# Patient Record
Sex: Female | Born: 1980 | Race: White | Hispanic: No | Smoking: Current every day smoker
Health system: Southern US, Community
[De-identification: ages and names within clinical notes are randomized; demographics above are authoritative.]

## PROBLEM LIST (undated history)

## (undated) DIAGNOSIS — J45909 Unspecified asthma, uncomplicated: Secondary | ICD-10-CM

## (undated) HISTORY — PX: CHOLECYSTECTOMY: SHX55

---

## 2004-09-08 ENCOUNTER — Emergency Department: Payer: Self-pay | Admitting: Emergency Medicine

## 2004-09-08 ENCOUNTER — Observation Stay: Payer: Self-pay | Admitting: Obstetrics & Gynecology

## 2004-09-09 ENCOUNTER — Inpatient Hospital Stay: Payer: Self-pay

## 2004-09-13 ENCOUNTER — Observation Stay: Payer: Self-pay

## 2004-09-30 ENCOUNTER — Observation Stay: Payer: Self-pay | Admitting: Obstetrics & Gynecology

## 2004-10-10 ENCOUNTER — Observation Stay: Payer: Self-pay

## 2004-10-17 ENCOUNTER — Observation Stay: Payer: Self-pay | Admitting: Obstetrics & Gynecology

## 2004-10-18 ENCOUNTER — Observation Stay: Payer: Self-pay | Admitting: Obstetrics & Gynecology

## 2004-10-20 ENCOUNTER — Observation Stay: Payer: Self-pay

## 2004-11-04 ENCOUNTER — Inpatient Hospital Stay: Payer: Self-pay

## 2004-11-14 ENCOUNTER — Emergency Department: Payer: Self-pay | Admitting: Emergency Medicine

## 2005-06-11 ENCOUNTER — Emergency Department: Payer: Self-pay | Admitting: Emergency Medicine

## 2005-06-11 ENCOUNTER — Other Ambulatory Visit: Payer: Self-pay

## 2005-11-05 ENCOUNTER — Emergency Department: Payer: Self-pay | Admitting: Emergency Medicine

## 2005-11-17 ENCOUNTER — Emergency Department: Payer: Self-pay | Admitting: Unknown Physician Specialty

## 2006-01-26 ENCOUNTER — Emergency Department: Payer: Self-pay | Admitting: Emergency Medicine

## 2006-04-20 ENCOUNTER — Emergency Department: Payer: Self-pay | Admitting: Emergency Medicine

## 2006-07-03 ENCOUNTER — Emergency Department: Payer: Self-pay | Admitting: Emergency Medicine

## 2006-08-02 ENCOUNTER — Emergency Department: Payer: Self-pay | Admitting: Emergency Medicine

## 2006-09-07 ENCOUNTER — Emergency Department: Payer: Self-pay | Admitting: Emergency Medicine

## 2006-09-30 ENCOUNTER — Emergency Department: Payer: Self-pay | Admitting: Emergency Medicine

## 2006-10-10 ENCOUNTER — Emergency Department: Payer: Self-pay | Admitting: General Practice

## 2006-12-02 ENCOUNTER — Emergency Department: Payer: Self-pay | Admitting: Emergency Medicine

## 2007-03-01 ENCOUNTER — Emergency Department: Payer: Self-pay | Admitting: Emergency Medicine

## 2007-03-13 ENCOUNTER — Emergency Department: Payer: Self-pay | Admitting: Emergency Medicine

## 2007-05-09 ENCOUNTER — Emergency Department: Payer: Self-pay | Admitting: Emergency Medicine

## 2007-08-09 ENCOUNTER — Emergency Department: Payer: Self-pay | Admitting: Emergency Medicine

## 2007-08-20 ENCOUNTER — Emergency Department: Payer: Self-pay | Admitting: Emergency Medicine

## 2007-09-02 ENCOUNTER — Emergency Department: Payer: Self-pay | Admitting: Emergency Medicine

## 2007-09-22 ENCOUNTER — Emergency Department: Payer: Self-pay | Admitting: Emergency Medicine

## 2007-11-04 ENCOUNTER — Ambulatory Visit: Payer: Self-pay

## 2007-11-12 ENCOUNTER — Observation Stay: Payer: Self-pay

## 2007-11-18 ENCOUNTER — Emergency Department: Payer: Self-pay | Admitting: Emergency Medicine

## 2007-11-29 ENCOUNTER — Emergency Department: Payer: Self-pay

## 2007-12-12 ENCOUNTER — Observation Stay: Payer: Self-pay | Admitting: Obstetrics & Gynecology

## 2007-12-13 ENCOUNTER — Ambulatory Visit: Payer: Self-pay | Admitting: Obstetrics & Gynecology

## 2007-12-29 ENCOUNTER — Observation Stay: Payer: Self-pay | Admitting: Obstetrics and Gynecology

## 2008-01-14 ENCOUNTER — Emergency Department: Payer: Self-pay | Admitting: Emergency Medicine

## 2008-03-05 ENCOUNTER — Observation Stay: Payer: Self-pay

## 2008-03-19 ENCOUNTER — Observation Stay: Payer: Self-pay | Admitting: Unknown Physician Specialty

## 2008-03-20 ENCOUNTER — Observation Stay: Payer: Self-pay | Admitting: Obstetrics and Gynecology

## 2008-03-22 ENCOUNTER — Inpatient Hospital Stay: Payer: Self-pay

## 2008-03-24 ENCOUNTER — Observation Stay: Payer: Self-pay | Admitting: Obstetrics and Gynecology

## 2008-03-31 ENCOUNTER — Observation Stay: Payer: Self-pay | Admitting: Obstetrics & Gynecology

## 2008-04-02 ENCOUNTER — Observation Stay: Payer: Self-pay

## 2008-04-07 ENCOUNTER — Observation Stay: Payer: Self-pay

## 2008-04-09 ENCOUNTER — Observation Stay: Payer: Self-pay

## 2008-04-11 ENCOUNTER — Observation Stay: Payer: Self-pay | Admitting: Obstetrics & Gynecology

## 2008-04-14 ENCOUNTER — Inpatient Hospital Stay: Payer: Self-pay | Admitting: Obstetrics and Gynecology

## 2008-05-22 ENCOUNTER — Emergency Department: Payer: Self-pay | Admitting: Emergency Medicine

## 2008-05-28 ENCOUNTER — Emergency Department: Payer: Self-pay | Admitting: Emergency Medicine

## 2008-06-08 ENCOUNTER — Ambulatory Visit: Payer: Self-pay | Admitting: Unknown Physician Specialty

## 2008-07-03 ENCOUNTER — Emergency Department: Payer: Self-pay | Admitting: Unknown Physician Specialty

## 2008-07-30 ENCOUNTER — Emergency Department: Payer: Self-pay | Admitting: Emergency Medicine

## 2008-08-31 ENCOUNTER — Emergency Department: Payer: Self-pay | Admitting: Emergency Medicine

## 2008-11-26 ENCOUNTER — Emergency Department: Payer: Self-pay | Admitting: Emergency Medicine

## 2010-05-08 ENCOUNTER — Emergency Department: Payer: Self-pay | Admitting: Emergency Medicine

## 2010-05-21 ENCOUNTER — Emergency Department: Payer: Self-pay | Admitting: Emergency Medicine

## 2010-06-07 ENCOUNTER — Emergency Department: Payer: Self-pay | Admitting: Emergency Medicine

## 2010-08-02 ENCOUNTER — Emergency Department: Payer: Self-pay | Admitting: Emergency Medicine

## 2010-11-08 ENCOUNTER — Emergency Department: Payer: Self-pay | Admitting: Emergency Medicine

## 2010-12-15 ENCOUNTER — Emergency Department: Payer: Self-pay | Admitting: Internal Medicine

## 2011-02-02 ENCOUNTER — Emergency Department: Payer: Self-pay | Admitting: Emergency Medicine

## 2011-02-06 ENCOUNTER — Emergency Department: Payer: Self-pay | Admitting: Emergency Medicine

## 2011-02-09 ENCOUNTER — Emergency Department: Payer: Self-pay | Admitting: Emergency Medicine

## 2011-02-17 ENCOUNTER — Emergency Department: Payer: Self-pay | Admitting: Internal Medicine

## 2011-03-20 ENCOUNTER — Emergency Department: Payer: Self-pay | Admitting: Internal Medicine

## 2012-02-03 ENCOUNTER — Emergency Department: Payer: Self-pay | Admitting: Emergency Medicine

## 2012-02-29 ENCOUNTER — Emergency Department: Payer: Self-pay | Admitting: Emergency Medicine

## 2012-04-20 ENCOUNTER — Emergency Department: Payer: Self-pay | Admitting: Emergency Medicine

## 2012-05-25 ENCOUNTER — Emergency Department: Payer: Self-pay | Admitting: Emergency Medicine

## 2012-05-25 LAB — COMPREHENSIVE METABOLIC PANEL
Albumin: 3.7 g/dL (ref 3.4–5.0)
Alkaline Phosphatase: 125 U/L (ref 50–136)
Calcium, Total: 8.8 mg/dL (ref 8.5–10.1)
Co2: 28 mmol/L (ref 21–32)
Creatinine: 0.99 mg/dL (ref 0.60–1.30)
EGFR (African American): >60
EGFR (Non-African Amer.): >60
Glucose: 103 mg/dL — ABNORMAL HIGH (ref 65–99)
Osmolality: 277 (ref 275–301)
SGOT(AST): 29 U/L (ref 15–37)

## 2012-05-25 LAB — URINALYSIS, COMPLETE
Bilirubin,UR: NEGATIVE
Glucose,UR: NEGATIVE mg/dL (ref 0–75)
Leukocyte Esterase: NEGATIVE
Nitrite: NEGATIVE
RBC,UR: <1 /HPF (ref 0–5)
Squamous Epithelial: <1
WBC UR: 1 /HPF (ref 0–5)

## 2012-05-25 LAB — CBC
HCT: 46.9 % (ref 35.0–47.0)
HGB: 15.4 g/dL (ref 12.0–16.0)
Platelet: 237 10*3/uL (ref 150–440)
RBC: 5.44 10*6/uL — ABNORMAL HIGH (ref 3.80–5.20)
RDW: 14.7 % — ABNORMAL HIGH (ref 11.5–14.5)
WBC: 11.2 10*3/uL — ABNORMAL HIGH (ref 3.6–11.0)

## 2012-05-25 LAB — LIPASE, BLOOD: Lipase: 55 U/L — ABNORMAL LOW (ref 73–393)

## 2012-11-07 ENCOUNTER — Emergency Department: Payer: Self-pay | Admitting: Internal Medicine

## 2012-11-07 LAB — COMPREHENSIVE METABOLIC PANEL
Albumin: 3.7 g/dL (ref 3.4–5.0)
Anion Gap: 7 (ref 7–16)
Calcium, Total: 8.8 mg/dL (ref 8.5–10.1)
Co2: 25 mmol/L (ref 21–32)
EGFR (African American): >60
EGFR (Non-African Amer.): >60
Glucose: 88 mg/dL (ref 65–99)
Potassium: 3.5 mmol/L (ref 3.5–5.1)
SGOT(AST): 25 U/L (ref 15–37)
SGPT (ALT): 32 U/L (ref 12–78)

## 2012-11-07 LAB — URINALYSIS, COMPLETE
Bacteria: NONE SEEN
Bilirubin,UR: NEGATIVE
Glucose,UR: NEGATIVE mg/dL (ref 0–75)
Leukocyte Esterase: NEGATIVE
Nitrite: NEGATIVE
RBC,UR: <1 /HPF (ref 0–5)
Squamous Epithelial: <1

## 2012-11-07 LAB — CBC
HCT: 43.1 % (ref 35.0–47.0)
MCHC: 33.9 g/dL (ref 32.0–36.0)
Platelet: 246 10*3/uL (ref 150–440)
RDW: 14.7 % — ABNORMAL HIGH (ref 11.5–14.5)
WBC: 11.3 10*3/uL — ABNORMAL HIGH (ref 3.6–11.0)

## 2012-11-08 ENCOUNTER — Emergency Department: Payer: Self-pay | Admitting: Emergency Medicine

## 2012-11-08 LAB — CBC
HCT: 44.2 % (ref 35.0–47.0)
HGB: 15.4 g/dL (ref 12.0–16.0)
MCHC: 34.7 g/dL (ref 32.0–36.0)
MCV: 84 fL (ref 80–100)
Platelet: 263 10*3/uL (ref 150–440)
RBC: 5.25 10*6/uL — ABNORMAL HIGH (ref 3.80–5.20)
RDW: 14.7 % — ABNORMAL HIGH (ref 11.5–14.5)
WBC: 11.3 10*3/uL — ABNORMAL HIGH (ref 3.6–11.0)

## 2012-11-09 ENCOUNTER — Emergency Department: Payer: Self-pay | Admitting: Emergency Medicine

## 2013-06-19 ENCOUNTER — Emergency Department: Payer: Self-pay | Admitting: Emergency Medicine

## 2013-06-19 LAB — URINALYSIS, COMPLETE
Bilirubin,UR: NEGATIVE
Glucose,UR: NEGATIVE mg/dL (ref 0–75)
Leukocyte Esterase: NEGATIVE
Nitrite: NEGATIVE
Ph: 6 (ref 4.5–8.0)
Protein: NEGATIVE
RBC,UR: <1 /HPF (ref 0–5)

## 2013-06-19 LAB — COMPREHENSIVE METABOLIC PANEL
Albumin: 3.7 g/dL (ref 3.4–5.0)
Anion Gap: 8 (ref 7–16)
Bilirubin,Total: 0.4 mg/dL (ref 0.2–1.0)
Calcium, Total: 8.9 mg/dL (ref 8.5–10.1)
Chloride: 107 mmol/L (ref 98–107)
Co2: 23 mmol/L (ref 21–32)
Creatinine: 0.7 mg/dL (ref 0.60–1.30)
EGFR (Non-African Amer.): >60
Osmolality: 273 (ref 275–301)
SGOT(AST): 22 U/L (ref 15–37)

## 2013-06-19 LAB — CBC
HGB: 15.7 g/dL (ref 12.0–16.0)
MCH: 29 pg (ref 26.0–34.0)
MCV: 84 fL (ref 80–100)

## 2013-08-02 ENCOUNTER — Emergency Department: Payer: Self-pay | Admitting: Emergency Medicine

## 2015-03-09 ENCOUNTER — Emergency Department
Admit: 2015-03-09 | Disposition: A | Discharge status: Home / Self Care | Payer: Self-pay | Admitting: Emergency Medicine

## 2015-03-23 ENCOUNTER — Emergency Department: Admit: 2015-03-23 | Disposition: A | Discharge status: Home / Self Care | Payer: Self-pay | Admitting: Student

## 2015-11-01 ENCOUNTER — Encounter: Payer: Self-pay | Admitting: Emergency Medicine

## 2015-11-01 ENCOUNTER — Emergency Department
Admission: EM | Admit: 2015-11-01 | Discharge: 2015-11-01 | Disposition: A | Discharge status: Home / Self Care | Payer: Self-pay | Attending: Emergency Medicine | Admitting: Emergency Medicine

## 2015-11-01 DIAGNOSIS — K029 Dental caries, unspecified: Secondary | ICD-10-CM | POA: Insufficient documentation

## 2015-11-01 DIAGNOSIS — K047 Periapical abscess without sinus: Secondary | ICD-10-CM | POA: Insufficient documentation

## 2015-11-01 DIAGNOSIS — K0381 Cracked tooth: Secondary | ICD-10-CM | POA: Insufficient documentation

## 2015-11-01 DIAGNOSIS — F17200 Nicotine dependence, unspecified, uncomplicated: Secondary | ICD-10-CM | POA: Insufficient documentation

## 2015-11-01 HISTORY — DX: Unspecified asthma, uncomplicated: J45.909

## 2015-11-01 MED ORDER — TRAMADOL HCL 50 MG PO TABS
50.0000 mg | ORAL_TABLET | Freq: Once | ORAL | Status: AC
  Administered 2015-11-01: 50 mg via ORAL
  Filled 2015-11-01: qty 1

## 2015-11-01 MED ORDER — PENICILLIN V POTASSIUM 500 MG PO TABS: 500.0000 mg | ORAL_TABLET | Freq: Four times a day (QID) | ORAL | Status: DC

## 2015-11-01 MED ORDER — NAPROXEN 500 MG PO TBEC: 500.0000 mg | DELAYED_RELEASE_TABLET | Freq: Two times a day (BID) | ORAL | Status: DC

## 2015-11-01 MED ORDER — PENICILLIN V POTASSIUM 500 MG PO TABS
500.0000 mg | ORAL_TABLET | Freq: Once | ORAL | Status: AC
  Administered 2015-11-01: 500 mg via ORAL
  Filled 2015-11-01: qty 1

## 2015-11-01 NOTE — ED Provider Notes (Signed)
[facility] Emergency Department Provider Note ____________________________________________  Time seen: 0832  I have reviewed the triage vital signs and the nursing notes.  HISTORY  Chief Complaint  Dental Pain  HPI Jodi Tran is a 34 y.o. female reports to the ED for evaluation of toothache pain to the right lower jaw for the last 4 days. She denies any interim fever, chills, or sweats. She does note poor dental hygiene and reports a chronically decayed and broken molar on the right lower jaw. She claims to have an appointment scheduled for a dental provider in the new year. She rates her discomfort is 9/10 in triage.  Past Medical History  Diagnosis Date  . Asthma     There are no active problems to display for this patient.   History reviewed. No pertinent past surgical history.  Current Outpatient Rx  Name  Route  Sig  Dispense  Refill  . albuterol (PROVENTIL HFA;VENTOLIN HFA) 108 (90 BASE) MCG/ACT inhaler   Inhalation   Inhale 2 puffs into the lungs every 6 (six) hours as needed for wheezing or shortness of breath.         . naproxen (EC NAPROSYN) 500 MG EC tablet   Oral   Take 1 tablet (500 mg total) by mouth 2 (two) times daily with a meal.   30 tablet   0   . penicillin v potassium (VEETID) 500 MG tablet   Oral   Take 1 tablet (500 mg total) by mouth 4 (four) times daily.   40 tablet   0    Allergies Bactrim  No family history on file.  Social History Social History  Substance Use Topics  . Smoking status: Current Every Day Smoker  . Smokeless tobacco: None  . Alcohol Use: No   Review of Systems  Constitutional: Negative for fever. Eyes: Negative for visual changes. ENT: Negative for sore throat. Dental pain as above. Cardiovascular: Negative for chest pain. Respiratory: Negative for shortness of breath. Gastrointestinal: Negative for abdominal pain, vomiting and diarrhea. Genitourinary: Negative for  dysuria. Musculoskeletal: Negative for back pain. Skin: Negative for rash. Neurological: Negative for headaches, focal weakness or numbness. ____________________________________________  PHYSICAL EXAM:  VITAL SIGNS: ED Triage Vitals  Enc Vitals Group     BP 11/01/15 0801 120/78 mmHg     Pulse Rate 11/01/15 0801 75     Resp 11/01/15 0801 18     Temp 11/01/15 0801 97.9 F (36.6 C)     Temp Source 11/01/15 0801 Oral     SpO2 11/01/15 0801 99 %     Weight 11/01/15 0801 159 lb (72.122 kg)     Height 11/01/15 0801 4\' 11"  (1.499 m)     Head Cir --      Peak Flow --      Pain Score 11/01/15 0802 9     Pain Loc --      Pain Edu? --      Excl. in GC? --    Constitutional: Alert and oriented. Well appearing and in no distress. Head: Normocephalic and atraumatic.      Eyes: Conjunctivae are normal. PERRL. Normal extraocular movements      Ears: Canals clear. TMs intact bilaterally.   Nose: No congestion/rhinorrhea.   Mouth/Throat: Mucous membranes are moist. The right lower jaw shows a chronically broken and decayed second molar. There is no obvious gum swelling or edema appreciated.   Neck: Supple. No thyromegaly. Hematological/Lymphatic/Immunological: No cervical lymphadenopathy. Cardiovascular: Normal rate, regular  rhythm.  Respiratory: Normal respiratory effort. No wheezes/rales/rhonchi. Gastrointestinal: Soft and nontender. No distention. Musculoskeletal: Nontender with normal range of motion in all extremities.  Neurologic:  Normal gait without ataxia. Normal speech and language. No gross focal neurologic deficits are appreciated. Skin:  Skin is warm, dry and intact. No rash noted. Psychiatric: Mood and affect are normal. Patient exhibits appropriate insight and judgment. ____________________________________________  PROCEDURES  Ultram 50 mg po Pen VK 500 mg po ____________________________________________  INITIAL IMPRESSION / ASSESSMENT AND PLAN / ED  COURSE  Patient with acute dental pain secondary to chronic dental caries and chronically fractured right lower molar. Patient discharged with prescription for pen VK to dose as directed. She is also provided with a prescription for EC-Naprosyn and a dose as needed for pain and swelling. Dental clinic follow-up list is provided for patient evaluation and definitive treatment of fractured tooth. ____________________________________________  FINAL CLINICAL IMPRESSION(S) / ED DIAGNOSES  Final diagnoses:  Dental abscess  Dental caries extending into pulp      Lissa Hoard, PA-C 11/01/15 1610  Jeanmarie Plant, MD 11/01/15 1521

## 2015-11-01 NOTE — ED Notes (Signed)
Pt here with c/o dental pain for 3 days now.

## 2015-11-01 NOTE — Discharge Instructions (Signed)
Dental Abscess A dental abscess is pus in or around a tooth. HOME CARE  Take medicines only as told by your dentist.  If you were prescribed antibiotic medicine, finish all of it even if you start to feel better.  Rinse your mouth (gargle) often with salt water.  Do not drive or use heavy machinery, like a lawn mower, while taking pain medicine.  Do not apply heat to the outside of your mouth.  Keep all follow-up visits as told by your dentist. This is important. GET HELP IF:  Your pain is worse, and medicine does not help. GET HELP RIGHT AWAY IF:  You have a fever or chills.  Your symptoms suddenly get worse.  You have a very bad headache.  You have problems breathing or swallowing.  You have trouble opening your mouth.  You have puffiness (swelling) in your neck or around your eye.   This information is not intended to replace advice given to you by your health care provider. Make sure you discuss any questions you have with your health care provider.   Document Released: 03/27/2015 Document Reviewed: 03/27/2015 Elsevier Interactive Patient Education 2016 Elsevier Inc.  Dental Caries Dental caries is tooth decay. This decay can cause a hole in teeth (cavity) that can get bigger and deeper over time. HOME CARE  Brush and floss your teeth. Do this at least two times a day.  Use a fluoride toothpaste.  Use a mouth rinse if told by your dentist or doctor.  Eat less sugary and starchy foods. Drink less sugary drinks.  Avoid snacking often on sugary and starchy foods. Avoid sipping often on sugary drinks.  Keep regular checkups and cleanings with your dentist.  Use fluoride supplements if told by your dentist or doctor.  Allow fluoride to be applied to teeth if told by your dentist or doctor.   This information is not intended to replace advice given to you by your health care provider. Make sure you discuss any questions you have with your health care  provider.   Document Released: 08/19/2008 Document Revised: 12/01/2014 Document Reviewed: 11/12/2012 Elsevier Interactive Patient Education 2016 Elsevier Inc.  Dental Pain Dental pain may be caused by many things, including:  Tooth decay (cavities or caries). Cavities expose the nerve of your tooth to air and hot or cold temperatures. This can cause pain or discomfort.  Abscess or infection. A dental abscess is a collection of infected pus from a bacterial infection in the inner part of the tooth (pulp). It usually occurs at the end of the tooth's root.  Injury.  An unknown reason (idiopathic). Your pain may be mild or severe. It may only occur when:  You are chewing.  You are exposed to hot or cold temperature.  You are eating or drinking sugary foods or beverages, such as soda or candy. Your pain may also be constant. HOME CARE INSTRUCTIONS Watch your dental pain for any changes. The following actions may help to lessen any discomfort that you are feeling:  Take medicines only as directed by your dentist.  If you were prescribed an antibiotic medicine, finish all of it even if you start to feel better.  Keep all follow-up visits as directed by your dentist. This is important.  Do not apply heat to the outside of your face.  Rinse your mouth or gargle with salt water if directed by your dentist. This helps with pain and swelling.  You can make salt water by adding  tsp  of salt to 1 cup of warm water.  Apply ice to the painful area of your face:  Put ice in a plastic bag.  Place a towel between your skin and the bag.  Leave the ice on for 20 minutes, 2-3 times per day.  Avoid foods or drinks that cause you pain, such as:  Very hot or very cold foods or drinks.  Sweet or sugary foods or drinks. SEEK MEDICAL CARE IF:  Your pain is not controlled with medicines.  Your symptoms are worse.  You have new symptoms. SEEK IMMEDIATE MEDICAL CARE IF:  You are unable  to open your mouth.  You are having trouble breathing or swallowing.  You have a fever.  Your face, neck, or jaw is swollen.   This information is not intended to replace advice given to you by your health care provider. Make sure you discuss any questions you have with your health care provider.   Document Released: 11/10/2005 Document Revised: 03/27/2015 Document Reviewed: 11/06/2014 Elsevier Interactive Patient Education [employer] Elsevier Inc.  Take the prescription antibiotic until completely gone. Follow-up with one of the dental clinics listed for dental extraction.  OPTIONS FOR DENTAL FOLLOW UP CARE  [place] Department of Health and Human Services - Local Safety Net Dental Clinics TripDoors.comhttp://www.ncdhhs.gov/dph/oralhealth/services/safetynetclinics.htm   [facility] [phone])  [facility] [phone])  [facility] [phone]  [facility] [phone])  [facility] [phone]) This clinic caters to the indigent population and is on a lottery system. Location: [employer] [address] [address], [address], [place] [facility] Hours: Wednesdays from 6pm - 9pm, patients seen by a lottery system. For dates, call or go to [email or web address] Services: Cleanings, fillings and simple extractions. Payment Options: DENTAL WORK IS FREE OF CHARGE. Bring proof of income or support. Best way to get seen: Arrive at 5:15 pm - this is a lottery, NOT first come/first serve, so arriving earlier will not increase your chances of being seen.     [facility] [phone] Select option 1 for emergencies   Location: [facility] [place], [address], [place] [facility] Hours: No walk-ins accepted - call the day before to schedule an appointment. Check in times are 9:30 am and 1:30 pm. Services: Simple extractions, temporary fillings,  pulpectomy/pulp debridement, uncomplicated abscess drainage. Payment Options: PAYMENT IS DUE AT THE TIME OF SERVICE.  Fee is usually $100-200, additional surgical procedures (e.g. abscess drainage) may be extra. Cash, checks, Visa/MasterCard accepted.  Can file Medicaid if patient is covered for dental - patient should call case worker to check. No discount for [facility] patients. Best way to get seen: MUST call the day before and get onto the schedule. Can usually be seen the next 1-2 days. No walk-ins accepted.     [facility] [phone]   Location: [facility], [address], [place] Clinic Hours: M, W, Th, F 8am or 1:30pm, Tues 9a or 1:30 - first come/first served. Services: Simple extractions, temporary fillings, uncomplicated abscess drainage.  You do not need to be an [facility] [place] [facility] [place] resident. Payment Options: PAYMENT IS DUE AT THE TIME OF SERVICE. Dental insurance, otherwise sliding scale - bring proof of income or support. Depending on income and treatment needed, cost is usually $50-200. Best way to get seen: Arrive early as it is first come/first served.     [facility] [phone]  Location: [address] Clinic Hours: Mon-Thu 8a-5p Services: Most basic dental services including extractions and fillings. Payment Options: PAYMENT IS DUE AT THE TIME OF SERVICE. Sliding scale, up to 50% off - bring proof if income or support. Medicaid with dental option accepted. Best way to get seen: Call to schedule an appointment, can usually be seen within 2 weeks OR they will try to see walk-ins - show up at 8a or 2p (you may have to wait).     [facility] [phone] [place] RESIDENTS ONLY   Location: [facility], [address], [place], [place] [address] Clinic Hours: By appointment only. Monday - Thursday 8am-5pm, Friday  8am-12pm Services: Cleanings, fillings, extractions. Payment Options: PAYMENT IS DUE AT THE TIME OF SERVICE. Cash, Visa or MasterCard. Sliding scale - $30 minimum per service. Best way to get seen: Come in to office, complete packet and make an appointment - need proof of income or support monies for each household member and proof of [facility] residence. Usually takes about a month to get in.     [facility] [phone]   Location: [address] [facility] Clinic Hours: Walk-in Urgent Care Dental Services are offered Monday-Friday mornings only. The numbers of emergencies accepted daily is limited to the number of providers available. Maximum 15 - Mondays, Wednesdays & Thursdays Maximum 10 - Tuesdays & Fridays Services: You do not need to be a [facility] resident to be seen for a dental emergency. Emergencies are defined as pain, swelling, abnormal bleeding, or dental trauma. Walkins will receive x-rays if needed. NOTE: Dental cleaning is not an emergency. Payment Options: PAYMENT IS DUE AT THE TIME OF SERVICE. Minimum co-pay is $40.00 for uninsured patients. Minimum co-pay is $3.00 for Medicaid with dental coverage. Dental Insurance is accepted and must be presented at time of visit. Medicare does not cover dental. Forms of payment: Cash, credit card, checks. Best way to get seen: If not previously registered with the clinic, walk-in dental registration begins at 7:15 am and is on a first come/first serve basis. If previously registered with the clinic, call to make an appointment.     [facility] [phone] [place] RESIDENTS ONLY   Location: [address], [place], [place] Clinic Hours: Mon-Thu 10a-2p Services: Extractions only! Payment Options: FREE (donations accepted) - bring proof of income or support Best way to get seen: Call and schedule an appointment OR come at 8am on the 1st Monday of every month  (except for holidays) when it is first come/first served.     [facility] [phone]   Location: [address] [place], [place] Clinic Hours: Friday mornings Services, Payment Options, Best way to get seen: Call for info

## 2015-12-31 ENCOUNTER — Encounter: Payer: Self-pay | Admitting: Medical Oncology

## 2015-12-31 ENCOUNTER — Emergency Department
Admission: EM | Admit: 2015-12-31 | Discharge: 2015-12-31 | Disposition: A | Discharge status: Home / Self Care | Payer: Self-pay | Attending: Emergency Medicine | Admitting: Emergency Medicine

## 2015-12-31 DIAGNOSIS — K0381 Cracked tooth: Secondary | ICD-10-CM | POA: Insufficient documentation

## 2015-12-31 DIAGNOSIS — K122 Cellulitis and abscess of mouth: Secondary | ICD-10-CM | POA: Insufficient documentation

## 2015-12-31 DIAGNOSIS — K029 Dental caries, unspecified: Secondary | ICD-10-CM | POA: Insufficient documentation

## 2015-12-31 DIAGNOSIS — F17200 Nicotine dependence, unspecified, uncomplicated: Secondary | ICD-10-CM | POA: Insufficient documentation

## 2015-12-31 MED ORDER — IBUPROFEN 800 MG PO TABS: 800.0000 mg | ORAL_TABLET | Freq: Three times a day (TID) | ORAL | Status: DC | PRN

## 2015-12-31 MED ORDER — AMOXICILLIN 500 MG PO TABS: 500.0000 mg | ORAL_TABLET | Freq: Three times a day (TID) | ORAL | Status: DC

## 2015-12-31 MED ORDER — HYDROCODONE-ACETAMINOPHEN 5-325 MG PO TABS: 1.0000 | ORAL_TABLET | Freq: Four times a day (QID) | ORAL | Status: DC | PRN

## 2015-12-31 MED ORDER — HYDROCODONE-ACETAMINOPHEN 5-325 MG PO TABS
1.0000 | ORAL_TABLET | Freq: Once | ORAL | Status: AC
  Administered 2015-12-31: 1 via ORAL
  Filled 2015-12-31: qty 1

## 2015-12-31 NOTE — ED Provider Notes (Signed)
[facility] Emergency Department Provider Note  ____________________________________________  Time seen: Approximately 6:19 PM  I have reviewed the triage vital signs and the nursing notes.   HISTORY  Chief Complaint Dental Pain    HPI Jodi Tran is a 35 y.o. female, NAD, presents to the emergency department with right-sided lower dental pain 2 days. States she has an appointment at the [facility] dental clinic in April to start dental care. Denies any injury or trauma to start her pain at this time. Has noted some swelling and pain across the right lower jawline. Has history of dental infections which have been similar in the past.   Past Medical History  Diagnosis Date  . Asthma     There are no active problems to display for this patient.   History reviewed. No pertinent past surgical history.  Current Outpatient Rx  Name  Route  Sig  Dispense  Refill  . albuterol (PROVENTIL HFA;VENTOLIN HFA) 108 (90 BASE) MCG/ACT inhaler   Inhalation   Inhale 2 puffs into the lungs every 6 (six) hours as needed for wheezing or shortness of breath.         Marland Kitchen amoxicillin (AMOXIL) 500 MG tablet   Oral   Take 1 tablet (500 mg total) by mouth 3 (three) times daily with meals.   30 tablet   0   . HYDROcodone-acetaminophen (NORCO) 5-325 MG tablet   Oral   Take 1 tablet by mouth every 6 (six) hours as needed for severe pain.   6 tablet   0   . ibuprofen (ADVIL,MOTRIN) 800 MG tablet   Oral   Take 1 tablet (800 mg total) by mouth every 8 (eight) hours as needed (pain).   60 tablet   0     Allergies Bactrim  No family history on file.  Social History Social History  Substance Use Topics  . Smoking status: Current Every Day Smoker  . Smokeless tobacco: None  . Alcohol Use: No     Review of Systems  Constitutional: No fever/chills ENT: Positive for right lower gumline and tooth pain. No sore throat, ear pain Cardiovascular: No chest  pain. Respiratory: No shortness of breath Skin: Negative for rash, swelling. Neurological: Negative for headaches, focal weakness or numbness. 10-point ROS otherwise negative.  ____________________________________________   PHYSICAL EXAM:  VITAL SIGNS: ED Triage Vitals  Enc Vitals Group     BP 12/31/15 1700 144/84 mmHg     Pulse Rate 12/31/15 1700 85     Resp 12/31/15 1700 18     Temp 12/31/15 1700 98.1 F (36.7 C)     Temp Source 12/31/15 1700 Oral     SpO2 12/31/15 1700 97 %     Weight 12/31/15 1700 150 lb (68.04 kg)     Height 12/31/15 1700  (1.499 m)     Head Cir --      Peak Flow --      Pain Score 12/31/15 1701 10     Pain Loc --      Pain Edu? --      Excl. in GC? --     Constitutional: Alert and oriented. Well appearing and in no acute distress. In pain Eyes: Conjunctivae are normal. PERRL. EOMI without pain.  Head: Atraumatic. Mouth/Throat: Diffuse caries and broken teeth. Right lower gumline erythematous and mildly swollen about the posterior portion. Mucous membranes are moist.  Hematological/Lymphatic/Immunilogical: No cervical lymphadenopathy. Respiratory: Normal respiratory effort without tachypnea or retractions.  Neurologic:  Normal speech and language. No gross focal neurologic deficits are appreciated.  Skin:  Skin is warm, dry and intact. No rash noted. Psychiatric: Mood and affect are normal. Speech and behavior are normal. Patient exhibits appropriate insight and judgement.   ____________________________________________   LABS  None  ____________________________________________  EKG  None ____________________________________________  RADIOLOGY  None ____________________________________________    PROCEDURES  Procedure(s) performed: None    Medications  HYDROcodone-acetaminophen (NORCO/VICODIN) 5-325 MG per tablet 1 tablet (1 tablet Oral Given 12/31/15 1837)     ____________________________________________   INITIAL  IMPRESSION / ASSESSMENT AND PLAN / ED COURSE  Patient's diagnosis is consistent with abscess oral soft tissues due to poor dental hygiene. Patient will be discharged home with prescriptions for Amoxicillin 500 mg take one tablet 3 times daily with food 10 days as well as Norco one tablet by mouth every 4-6 hours as needed for severe pain. Advised taking ibuprofen 800 mg one tablet by mouth every 8 hours with food until pain decreases. Keep appointment with [facility] dental clinic in April.  Advise calling them to see if any earlier appointments are available for follow-up. Patient given precautions to return to the ED for worsening or new onset symptoms.    ____________________________________________  FINAL CLINICAL IMPRESSION(S) / ED DIAGNOSES  Final diagnoses:  Abscess or cellulitis, oral soft tissue  Dental caries      NEW MEDICATIONS STARTED DURING THIS VISIT:  New Prescriptions   AMOXICILLIN (AMOXIL) 500 MG TABLET    Take 1 tablet (500 mg total) by mouth 3 (three) times daily with meals.   HYDROCODONE-ACETAMINOPHEN (NORCO) 5-325 MG TABLET    Take 1 tablet by mouth every 6 (six) hours as needed for severe pain.   IBUPROFEN (ADVIL,MOTRIN) 800 MG TABLET    Take 1 tablet (800 mg total) by mouth every 8 (eight) hours as needed (pain).         Hope Pigeon, PA-C 12/31/15 1838  Minna Antis, MD 01/01/16 [phone]

## 2015-12-31 NOTE — ED Notes (Signed)
Pt has a right lower toothache for 2 days.  Pt tearful.  Pt taking otc meds without relief.

## 2015-12-31 NOTE — Discharge Instructions (Signed)
Dental Caries °Dental caries (also called tooth decay) is the most common oral disease. It can occur at any age but is more common in children and young adults.  °HOW DENTAL CARIES DEVELOPS  °The process of decay begins when bacteria and foods (particularly sugars and starches) combine in your mouth to produce plaque. Plaque is a substance that sticks to the hard, outer surface of a tooth (enamel). The bacteria in plaque produce acids that attack enamel. These acids may also attack the root surface of a tooth (cementum) if it is exposed. Repeated attacks dissolve these surfaces and create holes in the tooth (cavities). If left untreated, the acids destroy the other layers of the tooth.  °RISK FACTORS °· Frequent sipping of sugary beverages.   °· Frequent snacking on sugary and starchy foods, especially those that easily get stuck in the teeth.   °· Poor oral hygiene.   °· Dry mouth.   °· Substance abuse such as methamphetamine abuse.   °· Broken or poor-fitting dental restorations.   °· Eating disorders.   °· Gastroesophageal reflux disease (GERD).   °· Certain radiation treatments to the head and neck. °SYMPTOMS °In the early stages of dental caries, symptoms are seldom present. Sometimes white, chalky areas may be seen on the enamel or other tooth layers. In later stages, symptoms may include: °· Pits and holes on the enamel. °· Toothache after sweet, hot, or cold foods or drinks are consumed. °· Pain around the tooth. °· Swelling around the tooth. °DIAGNOSIS  °Most of the time, dental caries is detected during a regular dental checkup. A diagnosis is made after a thorough medical and dental history is taken and the surfaces of your teeth are checked for signs of dental caries. Sometimes special instruments, such as lasers, are used to check for dental caries. Dental X-ray exams may be taken so that areas not visible to the eye (such as between the contact areas of the teeth) can be checked for cavities.    °TREATMENT  °If dental caries is in its early stages, it may be reversed with a fluoride treatment or an application of a remineralizing agent at the dental office. Thorough brushing and flossing at home is needed to aid these treatments. If it is in its later stages, treatment depends on the location and extent of tooth destruction:  °· If a small area of the tooth has been destroyed, the destroyed area will be removed and cavities will be filled with a material such as gold, silver amalgam, or composite resin.   °· If a large area of the tooth has been destroyed, the destroyed area will be removed and a cap (crown) will be fitted over the remaining tooth structure.   °· If the center part of the tooth (pulp) is affected, a procedure called a root canal will be needed before a filling or crown can be placed.   °· If most of the tooth has been destroyed, the tooth may need to be pulled (extracted). °HOME CARE INSTRUCTIONS °You can prevent, stop, or reverse dental caries at home by practicing good oral hygiene. Good oral hygiene includes: °· Thoroughly cleaning your teeth at least twice a day with a toothbrush and dental floss.   °· Using a fluoride toothpaste. A fluoride mouth rinse may also be used if recommended by your dentist or health care provider.   °· Restricting the amount of sugary and starchy foods and sugary liquids you consume.   °· Avoiding frequent snacking on these foods and sipping of these liquids.   °· Keeping regular visits with   a dentist for checkups and cleanings. °PREVENTION  °· Practice good oral hygiene. °· Consider a dental sealant. A dental sealant is a coating material that is applied by your dentist to the pits and grooves of teeth. The sealant prevents food from being trapped in them. It may protect the teeth for several years. °· Ask about fluoride supplements if you live in a community without fluorinated water or with water that has a low fluoride content. Use fluoride supplements  as directed by your dentist or health care provider. °· Allow fluoride varnish applications to teeth if directed by your dentist or health care provider. °  °This information is not intended to replace advice given to you by your health care provider. Make sure you discuss any questions you have with your health care provider. °  °Document Released: 08/02/2002 Document Revised: 12/01/2014 Document Reviewed: 11/12/2012 °Elsevier Interactive Patient Education ©2016 [employer] ° °Dental Pain °Dental pain may be caused by many things, including: °· Tooth decay (cavities or caries). Cavities expose the nerve of your tooth to air and hot or cold temperatures. This can cause pain or discomfort. °· Abscess or infection. A dental abscess is a collection of infected pus from a bacterial infection in the inner part of the tooth (pulp). It usually occurs at the end of the tooth's root. °· Injury. °· An unknown reason (idiopathic). °Your pain may be mild or severe. It may only occur when: °· You are chewing. °· You are exposed to hot or cold temperature. °· You are eating or drinking sugary foods or beverages, such as soda or candy. °Your pain may also be constant. °HOME CARE INSTRUCTIONS °Watch your dental pain for any changes. The following actions may help to lessen any discomfort that you are feeling: °· Take medicines only as directed by your dentist. °· If you were prescribed an antibiotic medicine, finish all of it even if you start to feel better. °· Keep all follow-up visits as directed by your dentist. This is important. °· Do not apply heat to the outside of your face. °· Rinse your mouth or gargle with salt water if directed by your dentist. This helps with pain and swelling. °¨ You can make salt water by adding ¼ tsp of salt to 1 cup of warm water. °· Apply ice to the painful area of your face: °¨ Put ice in a plastic bag. °¨ Place a towel between your skin and the bag. °¨ Leave the ice on for 20 minutes, 2-3  times per day. °· Avoid foods or drinks that cause you pain, such as: °¨ Very hot or very cold foods or drinks. °¨ Sweet or sugary foods or drinks. °SEEK MEDICAL CARE IF: °· Your pain is not controlled with medicines. °· Your symptoms are worse. °· You have new symptoms. °SEEK IMMEDIATE MEDICAL CARE IF: °· You are unable to open your mouth. °· You are having trouble breathing or swallowing. °· You have a fever. °· Your face, neck, or jaw is swollen. °  °This information is not intended to replace advice given to you by your health care provider. Make sure you discuss any questions you have with your health care provider. °  °Document Released: 11/10/2005 Document Revised: 03/27/2015 Document Reviewed: 11/06/2014 °Elsevier Interactive Patient Education ©2016 [employer] ° °

## 2015-12-31 NOTE — ED Notes (Signed)
Rt sided lower dental pain.

## 2016-05-01 ENCOUNTER — Encounter: Payer: Self-pay | Admitting: Emergency Medicine

## 2016-05-01 ENCOUNTER — Emergency Department
Admission: EM | Admit: 2016-05-01 | Discharge: 2016-05-01 | Disposition: A | Discharge status: Home / Self Care | Payer: Self-pay | Attending: Emergency Medicine | Admitting: Emergency Medicine

## 2016-05-01 DIAGNOSIS — Z5321 Procedure and treatment not carried out due to patient leaving prior to being seen by health care provider: Secondary | ICD-10-CM | POA: Insufficient documentation

## 2016-05-01 DIAGNOSIS — F17210 Nicotine dependence, cigarettes, uncomplicated: Secondary | ICD-10-CM | POA: Insufficient documentation

## 2016-05-01 NOTE — ED Notes (Signed)
C/O body aches, cough, sinus congestion x 2 days.  Denies fever.

## 2016-05-01 NOTE — ED Notes (Signed)
Pt not found in lobby or outside of ED entrance.

## 2018-03-29 ENCOUNTER — Emergency Department: Payer: Self-pay

## 2018-03-29 ENCOUNTER — Encounter: Payer: Self-pay | Admitting: Emergency Medicine

## 2018-03-29 ENCOUNTER — Emergency Department
Admission: EM | Admit: 2018-03-29 | Discharge: 2018-03-29 | Disposition: A | Discharge status: Home / Self Care | Payer: Self-pay | Attending: Emergency Medicine | Admitting: Emergency Medicine

## 2018-03-29 DIAGNOSIS — Y929 Unspecified place or not applicable: Secondary | ICD-10-CM | POA: Insufficient documentation

## 2018-03-29 DIAGNOSIS — S62617A Displaced fracture of proximal phalanx of left little finger, initial encounter for closed fracture: Secondary | ICD-10-CM | POA: Insufficient documentation

## 2018-03-29 DIAGNOSIS — F17210 Nicotine dependence, cigarettes, uncomplicated: Secondary | ICD-10-CM | POA: Insufficient documentation

## 2018-03-29 DIAGNOSIS — Y939 Activity, unspecified: Secondary | ICD-10-CM | POA: Insufficient documentation

## 2018-03-29 DIAGNOSIS — W230XXA Caught, crushed, jammed, or pinched between moving objects, initial encounter: Secondary | ICD-10-CM | POA: Insufficient documentation

## 2018-03-29 DIAGNOSIS — J45909 Unspecified asthma, uncomplicated: Secondary | ICD-10-CM | POA: Insufficient documentation

## 2018-03-29 DIAGNOSIS — Y998 Other external cause status: Secondary | ICD-10-CM | POA: Insufficient documentation

## 2018-03-29 MED ORDER — IBUPROFEN 600 MG PO TABS: 600.0000 mg | ORAL_TABLET | Freq: Three times a day (TID) | ORAL | 0 refills | Status: DC | PRN

## 2018-03-29 MED ORDER — OXYCODONE-ACETAMINOPHEN 5-325 MG PO TABS
1.0000 | ORAL_TABLET | Freq: Once | ORAL | Status: AC
Start: 2018-03-29 — End: 2018-03-29
  Administered 2018-03-29: 1 via ORAL
  Filled 2018-03-29: qty 1

## 2018-03-29 MED ORDER — HYDROCODONE-ACETAMINOPHEN 5-325 MG PO TABS: 1.0000 | ORAL_TABLET | Freq: Four times a day (QID) | ORAL | 0 refills | Status: DC | PRN

## 2018-03-29 NOTE — Discharge Instructions (Addendum)
Call make an appointment with Dr. Odis Luster who is the orthopedist on call.  Ice and elevation to reduce swelling and help control pain.  Ibuprofen 600 mg 3 times daily with food.  Norco 1 every 6 hours as needed for moderate to severe pain.  Keep dressing and splint clean and dry.

## 2018-03-29 NOTE — ED Provider Notes (Signed)
[facility] Emergency Department Provider Note   ____________________________________________   First MD Initiated Contact with Patient 03/29/18 1120     (approximate)  I have reviewed the triage vital signs and the nursing notes.   HISTORY  Chief Complaint Hand Injury   HPI Jodi Tran is a 37 y.o. female is here after her hand was slammed in a car door at 2 AM this morning.  Patient still continues to have pain.  She has moderate amount of bruising and swelling to the area.  Patient denies any previous injury to her hand.  Is not taking any over-the-counter medication and describes her pain as an 8 out of 10.   Past Medical History:  Diagnosis Date  . Asthma     There are no active problems to display for this patient.   Past Surgical History:  Procedure Laterality Date  . CHOLECYSTECTOMY      Prior to Admission medications   Medication Sig Start Date End Date Taking? Authorizing Provider  HYDROcodone-acetaminophen (NORCO) 5-325 MG tablet Take 1 tablet by mouth every 6 (six) hours as needed for severe pain. 03/29/18   Tommi Rumps, PA-C  ibuprofen (ADVIL,MOTRIN) 600 MG tablet Take 1 tablet (600 mg total) by mouth every 8 (eight) hours as needed. 03/29/18   Tommi Rumps, PA-C    Allergies Bactrim [sulfamethoxazole-trimethoprim]  No family history on file.  Social History Social History   Tobacco Use  . Smoking status: Current Every Day Smoker    Packs/day: 0.50    Types: Cigarettes  Substance Use Topics  . Alcohol use: No  . Drug use: No    Review of Systems Constitutional: No fever/chills Cardiovascular: Denies chest pain. Respiratory: Denies shortness of breath. Genitourinary: Negative for dysuria. Musculoskeletal: Positive left hand pain. Skin: Positive for abrasion. Neurological: Negative for  focal weakness or numbness. ___________________________________________   PHYSICAL EXAM:  VITAL SIGNS: ED Triage  Vitals [03/29/18 1011]  Enc Vitals Group     BP 128/84     Pulse Rate 68     Resp 20     Temp 97.8 F (36.6 C)     Temp Source Oral     SpO2 100 %     Weight 148 lb (67.1 kg)     Height  (1.499 m)     Head Circumference      Peak Flow      Pain Score 8     Pain Loc      Pain Edu?      Excl. in GC?    Constitutional: Alert and oriented. Well appearing and in no acute distress. Eyes: Conjunctivae are normal.  Head: Atraumatic. Neck: No stridor.   Cardiovascular: Normal rate, regular rhythm. Grossly normal heart sounds.  Good peripheral circulation. Respiratory: Normal respiratory effort.  No retractions. Lungs CTAB. Musculoskeletal: Positive for left hand pain with marketed tenderness noted on the fifth digit.  Range of motion is slightly limited secondary to pain and also swelling.  Motor sensory function intact.  Capillary refill is less than 3 seconds. Neurologic:  Normal speech and language. No gross focal neurologic deficits are appreciated. No gait instability. Skin:  Skin is warm, dry.  There is a superficial abrasion noted to the volar aspect of the left hand without active bleeding or foreign body noted. Psychiatric: Mood and affect are normal. Speech and behavior are normal.  ____________________________________________   LABS (all labs ordered are listed, but only abnormal results are displayed)  Labs Reviewed - No data to display RADIOLOGY  ED MD interpretation:   Left hand x-ray shows fracture through the proximal fifth phalanx.  Official radiology report(s): Dg Hand Complete Left  Result Date: 03/29/2018 CLINICAL DATA:  Pain and bruising.  Trauma. EXAM: LEFT HAND - COMPLETE 3+ VIEW COMPARISON:  None. FINDINGS: There is a fracture through the proximal fifth phalanx with mild displacement. No dislocation or foreign body identified. The remainder of the bones are intact. IMPRESSION: Mildly displaced fracture through the proximal fifth phalanx. Electronically  Signed   By: Gerome Sam III M.D   On: 03/29/2018 10:38    ____________________________________________   PROCEDURES  Procedure(s) performed:   .Splint Application Date/Time: 03/29/2018 12:35 PM Performed by: Lindajo Royal, NT Authorized by: Tommi Rumps, PA-C   Consent:    Consent obtained:  Verbal   Consent given by:  Patient   Risks discussed:  Pain   Alternatives discussed:  Referral Pre-procedure details:    Sensation:  Normal Procedure details:    Laterality:  Left   Location:  Finger   Finger:  L small finger   Splint type:  Ulnar gutter   Supplies:  Ortho-Glass Post-procedure details:    Pain:  Improved   Sensation:  Normal   Patient tolerance of procedure:  Tolerated well, no immediate complications    Critical Care performed: No  ____________________________________________   INITIAL IMPRESSION / ASSESSMENT AND PLAN / ED COURSE  As part of my medical decision making, I reviewed the following data within the electronic MEDICAL RECORD NUMBER Notes from prior ED visits and [place] Controlled Substance Database  Patient was given Percocet while in the emergency department and placed in a ulnar gutter splint.  She is instructed to ice and elevate to reduce swelling.  She is to follow-up with Dr. Odis Luster who is the orthopedist on call today.  Contact information was listed on her discharge papers.  She was also given a prescription to continue with pain medication as needed.  ____________________________________________   FINAL CLINICAL IMPRESSION(S) / ED DIAGNOSES  Final diagnoses:  Closed displaced fracture of proximal phalanx of left little finger, initial encounter     ED Discharge Orders        Ordered    HYDROcodone-acetaminophen (NORCO) 5-325 MG tablet  Every 6 hours PRN     03/29/18 1239    ibuprofen (ADVIL,MOTRIN) 600 MG tablet  Every 8 hours PRN     03/29/18 1239       Note:  This document was prepared using Dragon voice recognition  software and may include unintentional dictation errors.    Tommi Rumps, PA-C 03/29/18 1242    Minna Antis, MD 03/29/18 1357

## 2018-03-29 NOTE — ED Triage Notes (Signed)
Pt reprots door was slammed on her left hand at 2 this AM. Pt c/o pain. Bruising and swelling noted.

## 2018-03-29 NOTE — ED Notes (Signed)
See triage note  States she shut her left hand in house door early this am  Bruising and swelling noted to hand

## 2018-08-08 ENCOUNTER — Emergency Department: Payer: Self-pay

## 2018-08-08 ENCOUNTER — Encounter: Payer: Self-pay | Admitting: Emergency Medicine

## 2018-08-08 ENCOUNTER — Emergency Department
Admission: EM | Admit: 2018-08-08 | Discharge: 2018-08-08 | Disposition: A | Discharge status: Home / Self Care | Payer: Self-pay | Attending: Emergency Medicine | Admitting: Emergency Medicine

## 2018-08-08 ENCOUNTER — Other Ambulatory Visit: Payer: Self-pay

## 2018-08-08 DIAGNOSIS — R1031 Right lower quadrant pain: Secondary | ICD-10-CM | POA: Insufficient documentation

## 2018-08-08 DIAGNOSIS — J45909 Unspecified asthma, uncomplicated: Secondary | ICD-10-CM | POA: Insufficient documentation

## 2018-08-08 DIAGNOSIS — R109 Unspecified abdominal pain: Secondary | ICD-10-CM

## 2018-08-08 DIAGNOSIS — F17210 Nicotine dependence, cigarettes, uncomplicated: Secondary | ICD-10-CM | POA: Insufficient documentation

## 2018-08-08 LAB — URINALYSIS, COMPLETE (UACMP) WITH MICROSCOPIC
Bilirubin Urine: NEGATIVE
Glucose, UA: NEGATIVE mg/dL
Hgb urine dipstick: NEGATIVE
Ketones, ur: NEGATIVE mg/dL
LEUKOCYTES UA: NEGATIVE
Nitrite: NEGATIVE
PH: 5 (ref 5.0–8.0)
Protein, ur: NEGATIVE mg/dL
SPECIFIC GRAVITY, URINE: 1.008 (ref 1.005–1.030)

## 2018-08-08 LAB — BASIC METABOLIC PANEL
ANION GAP: 6 (ref 5–15)
BUN: <5 mg/dL — ABNORMAL LOW (ref 6–20)
CHLORIDE: 108 mmol/L (ref 98–111)
CO2: 26 mmol/L (ref 22–32)
Calcium: 8.6 mg/dL — ABNORMAL LOW (ref 8.9–10.3)
Creatinine, Ser: 0.52 mg/dL (ref 0.44–1.00)
GFR calc non Af Amer: >60 mL/min (ref >60)
Glucose, Bld: 113 mg/dL — ABNORMAL HIGH (ref 70–99)
Potassium: 4 mmol/L (ref 3.5–5.1)
Sodium: 140 mmol/L (ref 135–145)

## 2018-08-08 LAB — CBC
HEMATOCRIT: 47.2 % — AB (ref 35.0–47.0)
HEMOGLOBIN: 15.8 g/dL (ref 12.0–16.0)
MCH: 28.8 pg (ref 26.0–34.0)
MCHC: 33.4 g/dL (ref 32.0–36.0)
MCV: 86.1 fL (ref 80.0–100.0)
Platelets: 346 10*3/uL (ref 150–440)
RBC: 5.48 MIL/uL — AB (ref 3.80–5.20)
RDW: 15.4 % — ABNORMAL HIGH (ref 11.5–14.5)
WBC: 15.4 10*3/uL — ABNORMAL HIGH (ref 3.6–11.0)

## 2018-08-08 LAB — TROPONIN I: Troponin I: <0.03 ng/mL (ref <0.03)

## 2018-08-08 LAB — FIBRIN DERIVATIVES D-DIMER (ARMC ONLY): Fibrin derivatives D-dimer (ARMC): 242.44 ng/mL (FEU) (ref 0.00–499.00)

## 2018-08-08 LAB — POCT PREGNANCY, URINE: Preg Test, Ur: NEGATIVE

## 2018-08-08 MED ORDER — ONDANSETRON HCL 4 MG/2ML IJ SOLN
INTRAMUSCULAR | Status: DC
Start: 2018-08-08 — End: 2018-08-08
  Filled 2018-08-08: qty 2

## 2018-08-08 MED ORDER — OXYCODONE-ACETAMINOPHEN 5-325 MG PO TABS
1.0000 | ORAL_TABLET | Freq: Once | ORAL | Status: AC
  Administered 2018-08-08: 1 via ORAL

## 2018-08-08 MED ORDER — SODIUM CHLORIDE 0.9 % IV BOLUS
1000.0000 mL | Freq: Once | INTRAVENOUS | Status: AC
  Administered 2018-08-08: 1000 mL via INTRAVENOUS

## 2018-08-08 MED ORDER — OXYCODONE-ACETAMINOPHEN 5-325 MG PO TABS
ORAL_TABLET | ORAL | Status: AC
  Filled 2018-08-08: qty 1

## 2018-08-08 MED ORDER — FENTANYL CITRATE (PF) 100 MCG/2ML IJ SOLN
50.0000 ug | INTRAMUSCULAR | Status: DC | PRN
  Administered 2018-08-08: 50 ug via NASAL

## 2018-08-08 MED ORDER — MORPHINE SULFATE (PF) 4 MG/ML IV SOLN
INTRAVENOUS | Status: AC
  Filled 2018-08-08: qty 1

## 2018-08-08 MED ORDER — CARISOPRODOL 350 MG PO TABS: 350.0000 mg | ORAL_TABLET | Freq: Three times a day (TID) | ORAL | 0 refills | Status: DC | PRN

## 2018-08-08 MED ORDER — ONDANSETRON HCL 4 MG/2ML IJ SOLN
4.0000 mg | Freq: Once | INTRAMUSCULAR | Status: AC
  Administered 2018-08-08: 4 mg via INTRAVENOUS

## 2018-08-08 MED ORDER — MORPHINE SULFATE (PF) 4 MG/ML IV SOLN
4.0000 mg | Freq: Once | INTRAVENOUS | Status: AC
  Administered 2018-08-08: 4 mg via INTRAVENOUS

## 2018-08-08 MED ORDER — DICLOFENAC SODIUM 1 % TD GEL: TRANSDERMAL | 0 refills | Status: DC

## 2018-08-08 MED ORDER — FENTANYL CITRATE (PF) 100 MCG/2ML IJ SOLN
INTRAMUSCULAR | Status: AC
  Filled 2018-08-08: qty 2

## 2018-08-08 MED ORDER — KETOROLAC TROMETHAMINE 30 MG/ML IJ SOLN
30.0000 mg | Freq: Once | INTRAMUSCULAR | Status: AC
  Administered 2018-08-08: 30 mg via INTRAVENOUS
  Filled 2018-08-08: qty 1

## 2018-08-08 NOTE — ED Notes (Signed)
Pt unable to provide urine sample at this time. States she urinated right before coming in. Pt given cup and informed we cant complete any scans until pt provides sample for POC preg test.

## 2018-08-08 NOTE — ED Triage Notes (Signed)
Pt to ED via POV c/o right flank pain x 2-3 days. Pt states that she has had nausea but no vomiting. Pt tearful in triage.

## 2018-08-08 NOTE — ED Provider Notes (Signed)
[facility] Emergency Department Provider Note ____________________________________________   First MD Initiated Contact with Patient 08/08/18 1210     (approximate)  I have reviewed the triage vital signs and the nursing notes.   HISTORY  Chief Complaint Flank Pain  HPI Jodi Tran is a 37 y.o. female with a history of asthma who is presenting to the emergency department today with right-sided abdominal as well as flank pain.  She says the pain is sharp and squeezing and is a 10 out of 10.  Family history of ovarian cyst.  The patient has a history of asthma but no history of ovarian cyst, or kidney stones.  Patient still has her appendix.  Says that she is been nauseous but has not vomited.  Does not report any diarrhea.  Nuys any urinary complaints.  Does not report any vaginal discharge or bleeding.   Past Medical History:  Diagnosis Date  . Asthma     There are no active problems to display for this patient.   Past Surgical History:  Procedure Laterality Date  . CHOLECYSTECTOMY      Prior to Admission medications   Medication Sig Start Date End Date Taking? Authorizing Provider  albuterol (PROVENTIL HFA;VENTOLIN HFA) 108 (90 Base) MCG/ACT inhaler Inhale 2 puffs into the lungs every 4 (four) hours as needed for wheezing.   Yes [provider]  HYDROcodone-acetaminophen (NORCO) 5-325 MG tablet Take 1 tablet by mouth every 6 (six) hours as needed for severe pain. Patient not taking: Reported on 08/08/2018 03/29/18   Tommi Rumps, PA-C  ibuprofen (ADVIL,MOTRIN) 600 MG tablet Take 1 tablet (600 mg total) by mouth every 8 (eight) hours as needed. Patient not taking: Reported on 08/08/2018 03/29/18   Tommi Rumps, PA-C    Allergies Bactrim [sulfamethoxazole-trimethoprim]  No family history on file.  Social History Social History   Tobacco Use  . Smoking status: Current Every Day Smoker    Packs/day: 0.50    Types: Cigarettes    . Smokeless tobacco: Never Used  Substance Use Topics  . Alcohol use: No  . Drug use: No    Review of Systems  Constitutional: No fever/chills Eyes: No visual changes. ENT: No sore throat. Cardiovascular: Denies chest pain. Respiratory: Denies shortness of breath. Gastrointestinal:  no vomiting.  No diarrhea.  No constipation. Genitourinary: Negative for dysuria. Musculoskeletal: States pain rating to the right flank. Skin: Negative for rash. Neurological: Negative for headaches, focal weakness or numbness.   ____________________________________________   PHYSICAL EXAM:  VITAL SIGNS: ED Triage Vitals  Enc Vitals Group     BP 08/08/18 1156 100/68     Pulse Rate 08/08/18 1154 (!) 102     Resp 08/08/18 1154 18     Temp 08/08/18 1154 98.7 F (37.1 C)     Temp Source 08/08/18 1154 Oral     SpO2 08/08/18 1154 96 %     Weight 08/08/18 1155 153 lb (69.4 kg)     Height 08/08/18 1155 4\' 11"  (1.499 m)     Head Circumference --      Peak Flow --      Pain Score 08/08/18 1153 10     Pain Loc --      Pain Edu? --      Excl. in GC? --     Constitutional: Alert and oriented.  Patient appears uncomfortable. Eyes: Conjunctivae are normal.  Head: Atraumatic. Nose: No congestion/rhinnorhea. Mouth/Throat: Mucous membranes are moist.  Neck: No stridor.  Cardiovascular: Normal rate, regular rhythm. Grossly normal heart sounds.   Respiratory: Normal respiratory effort.  No retractions. Lungs CTAB. Gastrointestinal: Soft with right lower as well as right periumbilical and right CVA tenderness to palpation.  Tenderness is moderate without rebound or guarding. Musculoskeletal: No lower extremity tenderness nor edema.  No joint effusions. Neurologic:  Normal speech and language. No gross focal neurologic deficits are appreciated. Skin:  Skin is warm, dry and intact. No rash noted. Psychiatric: Mood and affect are normal. Speech and behavior are  normal.  ____________________________________________   LABS (all labs ordered are listed, but only abnormal results are displayed)  Labs Reviewed  URINALYSIS, COMPLETE (UACMP) WITH MICROSCOPIC - Abnormal; Notable for the following components:      Result Value   Color, Urine YELLOW (*)    APPearance CLEAR (*)    Bacteria, UA RARE (*)    All other components within normal limits  BASIC METABOLIC PANEL - Abnormal; Notable for the following components:   Glucose, Bld 113 (*)    BUN <5 (*)    Calcium 8.6 (*)    All other components within normal limits  CBC - Abnormal; Notable for the following components:   WBC 15.4 (*)    RBC 5.48 (*)    HCT 47.2 (*)    RDW 15.4 (*)    All other components within normal limits  FIBRIN DERIVATIVES D-DIMER ([facility] ONLY)  TROPONIN I  POC URINE PREG, ED  POCT PREGNANCY, URINE   ____________________________________________  EKG  ED ECG REPORT I, Arelia Longestavid M Schaevitz, the attending physician, personally viewed and interpreted this ECG.   Date: 08/08/2018  EKG Time: 1515  Rate: 60  Rhythm: normal sinus rhythm  Axis: Normal  Intervals:none  ST&T Change: No ST segment elevation or depression.  No abnormal T wave inversion.  ____________________________________________  RADIOLOGY  Chest x-ray is normal.  CT abdomen and pelvis without acute pathology. ____________________________________________   PROCEDURES  Procedure(s) performed:   Procedures  Critical Care performed:   ____________________________________________   INITIAL IMPRESSION / ASSESSMENT AND PLAN / ED COURSE  Pertinent labs & imaging results that were available during my care of the patient were reviewed by me and considered in my medical decision making (see chart for details).  Differential diagnosis includes, but is not limited to, ovarian cyst, ovarian torsion, acute appendicitis, diverticulitis, urinary tract infection/pyelonephritis, endometriosis, bowel  obstruction, colitis, renal colic, gastroenteritis, hernia, fibroids, endometriosis, pregnancy related pain including ectopic pregnancy, etc. As part of my medical decision making, I reviewed the following data within the electronic MEDICAL RECORD NUMBER Notes from prior ED visits  ----------------------------------------- 3:44 PM on 08/08/2018 -----------------------------------------  Patient at this time states that she only had minimal relief with morphine.  We will add Toradol.  Will search for alternate pathology.  D-dimer ordered.  Patient says the pain is worsened with movement.  However, denies any injury.  ----------------------------------------- 5:06 PM on 08/08/2018 -----------------------------------------  Patient at this time without significant pain relief after morphine and then Toradol.  Very reassuring work-up.  White blood cell count is elevated but this appears to be somewhat chronic.  We will send a urine culture.  Asked again and the patient is denying any vaginal discharge or bleeding.  Pain worsened with movement.  Will treat as musculoskeletal at this time with a muscle relaxer as well as an anti-inflammatory salve.  Patient to be given follow-up with primary care.  She is understanding of the diagnosis as well as treatment plan  willing to comply.  Furthermore, the patient had a negative d-dimer as well as reassuring cardiac work-up with a negative troponin as well as a reassuring chest x-ray. ____________________________________________   FINAL CLINICAL IMPRESSION(S) / ED DIAGNOSES  Flank pain.  NEW MEDICATIONS STARTED DURING THIS VISIT:  New Prescriptions   No medications on file     Note:  This document was prepared using Dragon voice recognition software and may include unintentional dictation errors.     Myrna Blazer, MD 08/08/18 [phone]

## 2018-08-08 NOTE — ED Notes (Signed)
Pt states pain increased after getting up to use bathroom

## 2018-08-08 NOTE — ED Notes (Signed)
Pt extremely uncomfortable and pacing room, unable to remain still at this time.

## 2018-08-08 NOTE — ED Triage Notes (Signed)
First Nurse Note:  C/O right flank pain x 3-4 days.  Denies dysuria.  Patient is AAOx3.  Skin warm and dry.  Posture leans forward. Gait steady.

## 2018-08-10 LAB — POCT PREGNANCY, URINE: PREG TEST UR: NEGATIVE

## 2019-02-17 ENCOUNTER — Other Ambulatory Visit: Payer: Self-pay

## 2019-02-17 ENCOUNTER — Emergency Department: Payer: Self-pay

## 2019-02-17 ENCOUNTER — Emergency Department
Admission: EM | Admit: 2019-02-17 | Discharge: 2019-02-17 | Disposition: A | Discharge status: Home / Self Care | Payer: Self-pay | Attending: Emergency Medicine | Admitting: Emergency Medicine

## 2019-02-17 ENCOUNTER — Encounter: Payer: Self-pay | Admitting: *Deleted

## 2019-02-17 DIAGNOSIS — K529 Noninfective gastroenteritis and colitis, unspecified: Secondary | ICD-10-CM | POA: Insufficient documentation

## 2019-02-17 DIAGNOSIS — R197 Diarrhea, unspecified: Secondary | ICD-10-CM

## 2019-02-17 DIAGNOSIS — R112 Nausea with vomiting, unspecified: Secondary | ICD-10-CM

## 2019-02-17 DIAGNOSIS — F17210 Nicotine dependence, cigarettes, uncomplicated: Secondary | ICD-10-CM | POA: Insufficient documentation

## 2019-02-17 DIAGNOSIS — J45909 Unspecified asthma, uncomplicated: Secondary | ICD-10-CM | POA: Insufficient documentation

## 2019-02-17 LAB — CBC WITH DIFFERENTIAL/PLATELET
ABS IMMATURE GRANULOCYTES: 0.28 10*3/uL — AB (ref 0.00–0.07)
BASOS PCT: 0 %
Basophils Absolute: 0.1 10*3/uL (ref 0.0–0.1)
EOS ABS: 0 10*3/uL (ref 0.0–0.5)
Eosinophils Relative: 0 %
HCT: 53.5 % — ABNORMAL HIGH (ref 36.0–46.0)
Hemoglobin: 17.4 g/dL — ABNORMAL HIGH (ref 12.0–15.0)
Immature Granulocytes: 2 %
Lymphocytes Relative: 4 %
Lymphs Abs: 0.7 10*3/uL (ref 0.7–4.0)
MCH: 27.5 pg (ref 26.0–34.0)
MCHC: 32.5 g/dL (ref 30.0–36.0)
MCV: 84.5 fL (ref 80.0–100.0)
MONO ABS: 0.7 10*3/uL (ref 0.1–1.0)
Monocytes Relative: 4 %
NEUTROS ABS: 16 10*3/uL — AB (ref 1.7–7.7)
NEUTROS PCT: 90 %
PLATELETS: 373 10*3/uL (ref 150–400)
RBC: 6.33 MIL/uL — AB (ref 3.87–5.11)
RDW: 14.6 % (ref 11.5–15.5)
WBC: 17.7 10*3/uL — AB (ref 4.0–10.5)
nRBC: 0 % (ref 0.0–0.2)

## 2019-02-17 LAB — COMPREHENSIVE METABOLIC PANEL
ALT: 13 U/L (ref 0–44)
ANION GAP: 13 (ref 5–15)
AST: 28 U/L (ref 15–41)
Albumin: 3.9 g/dL (ref 3.5–5.0)
Alkaline Phosphatase: 72 U/L (ref 38–126)
BUN: 9 mg/dL (ref 6–20)
CHLORIDE: 107 mmol/L (ref 98–111)
CO2: 16 mmol/L — AB (ref 22–32)
Calcium: 8.8 mg/dL — ABNORMAL LOW (ref 8.9–10.3)
Creatinine, Ser: 0.7 mg/dL (ref 0.44–1.00)
Glucose, Bld: 154 mg/dL — ABNORMAL HIGH (ref 70–99)
POTASSIUM: 4.7 mmol/L (ref 3.5–5.1)
SODIUM: 136 mmol/L (ref 135–145)
Total Bilirubin: 0.9 mg/dL (ref 0.3–1.2)
Total Protein: 7.2 g/dL (ref 6.5–8.1)

## 2019-02-17 LAB — URINALYSIS, COMPLETE (UACMP) WITH MICROSCOPIC
BILIRUBIN URINE: NEGATIVE
Bacteria, UA: NONE SEEN
GLUCOSE, UA: NEGATIVE mg/dL
Hgb urine dipstick: NEGATIVE
KETONES UR: 20 mg/dL — AB
LEUKOCYTE UA: NEGATIVE
NITRITE: NEGATIVE
PH: 5 (ref 5.0–8.0)
Protein, ur: NEGATIVE mg/dL
Specific Gravity, Urine: 1.023 (ref 1.005–1.030)

## 2019-02-17 LAB — LIPASE, BLOOD: LIPASE: 18 U/L (ref 11–51)

## 2019-02-17 LAB — POCT PREGNANCY, URINE: Preg Test, Ur: NEGATIVE

## 2019-02-17 LAB — C DIFFICILE QUICK SCREEN W PCR REFLEX
C DIFFICILE (CDIFF) TOXIN: NEGATIVE
C Diff antigen: NEGATIVE
C Diff interpretation: NOT DETECTED

## 2019-02-17 MED ORDER — ONDANSETRON HCL 4 MG/2ML IJ SOLN
4.0000 mg | Freq: Once | INTRAMUSCULAR | Status: AC
  Administered 2019-02-17: 4 mg via INTRAVENOUS

## 2019-02-17 MED ORDER — ONDANSETRON HCL 4 MG/2ML IJ SOLN
4.0000 mg | Freq: Once | INTRAMUSCULAR | Status: AC
  Administered 2019-02-17: 4 mg via INTRAVENOUS
  Filled 2019-02-17: qty 2

## 2019-02-17 MED ORDER — SODIUM CHLORIDE 0.9 % IV BOLUS
1000.0000 mL | Freq: Once | INTRAVENOUS | Status: AC
  Administered 2019-02-17: 1000 mL via INTRAVENOUS

## 2019-02-17 MED ORDER — KETOROLAC TROMETHAMINE 30 MG/ML IJ SOLN
15.0000 mg | Freq: Once | INTRAMUSCULAR | Status: AC
  Administered 2019-02-17: 15 mg via INTRAVENOUS
  Filled 2019-02-17: qty 1

## 2019-02-17 MED ORDER — ONDANSETRON 4 MG PO TBDP: 4.0000 mg | ORAL_TABLET | Freq: Three times a day (TID) | ORAL | 0 refills | Status: DC | PRN

## 2019-02-17 NOTE — ED Triage Notes (Signed)
Pt is actively vomiting in triage.  Pt states that this began at 6:30pm and lasted "all night".  Pt states that she had diarrhea "every time I throw up".  Pt reports that she has a chronic cough bc she smokes but no changes.  No fever or chills. No one around her sick.

## 2019-02-17 NOTE — ED Triage Notes (Signed)
Says vomiting and diarrhea since 630 last night

## 2019-02-17 NOTE — ED Provider Notes (Addendum)
[facility] Emergency Department Provider Note  ____________________________________________  Time seen: Approximately 10:39 AM  I have reviewed the triage vital signs and the nursing notes.   HISTORY  Chief Complaint Emesis and Diarrhea   HPI Jodi Tran is a 38 y.o. female with history of asthma who presents for evaluation of vomiting and diarrhea.  Symptoms started last night.  Patient reports more than 20 episodes of watery diarrhea nonbloody nonbilious emesis.  She is complaining of diffuse soreness in her abdomen which she thinks is from vomiting too much.  No melena, coffee-ground emesis, hematemesis, hematochezia, fever or chills.  She also has a cough however that is chronic for her since she is a smoker.  She is denying any shortness of breath or chest pain.  No exposure or recent travel to areas endemic of Covid 19.  Past Medical History:  Diagnosis Date  . Asthma     There are no active problems to display for this patient.   Past Surgical History:  Procedure Laterality Date  . CHOLECYSTECTOMY      Prior to Admission medications   Medication Sig Start Date End Date Taking? Authorizing Provider  albuterol (PROVENTIL HFA;VENTOLIN HFA) 108 (90 Base) MCG/ACT inhaler Inhale 2 puffs into the lungs every 4 (four) hours as needed for wheezing.    [provider]  carisoprodol (SOMA) 350 MG tablet Take 1 tablet (350 mg total) by mouth 3 (three) times daily as needed. 08/08/18   Schaevitz, Myra Rudeavid Matthew, MD  diclofenac sodium (VOLTAREN) 1 % GEL Apply to affected area as needed for pain 08/08/18   Myrna BlazerSchaevitz, David Matthew, MD  HYDROcodone-acetaminophen (NORCO) 5-325 MG tablet Take 1 tablet by mouth every 6 (six) hours as needed for severe pain. Patient not taking: Reported on 08/08/2018 03/29/18   Tommi RumpsSummers, Rhonda L, PA-C  ibuprofen (ADVIL,MOTRIN) 600 MG tablet Take 1 tablet (600 mg total) by mouth every 8 (eight) hours as needed. Patient not  taking: Reported on 08/08/2018 03/29/18   Bridget HartshornSummers, Rhonda L, PA-C  ondansetron (ZOFRAN ODT) 4 MG disintegrating tablet Take 1 tablet (4 mg total) by mouth every 8 (eight) hours as needed. 02/17/19   Nita SickleVeronese, [place] Proper, MD    Allergies Bactrim [sulfamethoxazole-trimethoprim]  No family history on file.  Social History Social History   Tobacco Use  . Smoking status: Current Every Day Smoker    Packs/day: 0.50    Types: Cigarettes  . Smokeless tobacco: Never Used  Substance Use Topics  . Alcohol use: No  . Drug use: No    Review of Systems  Constitutional: Negative for fever. Eyes: Negative for visual changes. ENT: Negative for sore throat. Neck: No neck pain  Cardiovascular: Negative for chest pain. Respiratory: Negative for shortness of breath. + cough Gastrointestinal: + abdominal pain, vomiting and diarrhea. Genitourinary: Negative for dysuria. Musculoskeletal: Negative for back pain. Skin: Negative for rash. Neurological: Negative for headaches, weakness or numbness. Psych: No SI or HI  ____________________________________________   PHYSICAL EXAM:  VITAL SIGNS: ED Triage Vitals  Enc Vitals Group     BP 02/17/19 1012 123/85     Pulse Rate 02/17/19 1012 (!) 103     Resp 02/17/19 1012 (!) 22     Temp 02/17/19 1012 98.4 F (36.9 C)     Temp Source 02/17/19 1012 Oral     SpO2 02/17/19 1012 99 %     Weight 02/17/19 1020 169 lb (76.7 kg)     Height 02/17/19 1020 4\' 11"  (1.499 m)  Head Circumference --      Peak Flow --      Pain Score 02/17/19 1020 7     Pain Loc --      Pain Edu? --      Excl. in [place]? --     Constitutional: Alert and oriented, looks uncomfortable.  HEENT:      Head: Normocephalic and atraumatic.         Eyes: Conjunctivae are normal. Sclera is non-icteric.       Mouth/Throat: Mucous membranes are moist.       Neck: Supple with no signs of meningismus. Cardiovascular: Tachycardic with regular rate and rhythm. No murmurs, gallops, or rubs.  2+ symmetrical distal pulses are present in all extremities. No JVD. Respiratory: Normal respiratory effort. Lungs are clear to auscultation bilaterally. No wheezes, crackles, or rhonchi.  Gastrointestinal: Soft, mild diffuse tenderness to palpation, and non distended with positive bowel sounds. No rebound or guarding. Musculoskeletal: Nontender with normal range of motion in all extremities. No edema, cyanosis, or erythema of extremities. Neurologic: Normal speech and language. Face is symmetric. Moving all extremities. No gross focal neurologic deficits are appreciated. Skin: Skin is warm, dry and intact. No rash noted. Psychiatric: Mood and affect are normal. Speech and behavior are normal.  ____________________________________________   LABS (all labs ordered are listed, but only abnormal results are displayed)  Labs Reviewed  CBC WITH DIFFERENTIAL/PLATELET - Abnormal; Notable for the following components:      Result Value   WBC 17.7 (*)    RBC 6.33 (*)    Hemoglobin 17.4 (*)    HCT 53.5 (*)    Neutro Abs 16.0 (*)    Abs Immature Granulocytes 0.28 (*)    All other components within normal limits  COMPREHENSIVE METABOLIC PANEL - Abnormal; Notable for the following components:   CO2 16 (*)    Glucose, Bld 154 (*)    Calcium 8.8 (*)    All other components within normal limits  URINALYSIS, COMPLETE (UACMP) WITH MICROSCOPIC - Abnormal; Notable for the following components:   Color, Urine YELLOW (*)    APPearance CLOUDY (*)    Ketones, ur 20 (*)    All other components within normal limits  C DIFFICILE QUICK SCREEN W PCR REFLEX  LIPASE, BLOOD  POCT PREGNANCY, URINE   ____________________________________________  EKG  none  ____________________________________________  RADIOLOGY  I have personally reviewed the images performed during this visit and I agree with the Radiologist's read.   Interpretation by Radiologist:  Dg Chest Portable 1 View  Result Date:  02/17/2019 CLINICAL DATA:  Cough.  Diarrhea, vomiting EXAM: PORTABLE CHEST 1 VIEW COMPARISON:  08/08/2018 FINDINGS: The heart size and mediastinal contours are within normal limits. Both lungs are clear. The visualized skeletal structures are unremarkable. IMPRESSION: No active disease. Electronically Signed   By: Signa Kell M.D.   On: 02/17/2019 10:52      ____________________________________________   PROCEDURES  Procedure(s) performed: None Procedures Critical Care performed:  None ____________________________________________   INITIAL IMPRESSION / ASSESSMENT AND PLAN / ED COURSE  38 y.o. female with history of asthma who presents for evaluation of vomiting and diarrhea.  Patient looks uncomfortable but in no significant distress, slightly tachycardic but afebrile, abdomen is soft with diffuse tenderness throughout, no localized tenderness rebound or guarding.  Differential diagnosis including viral process with vomiting, diarrhea and cough versus pneumonia versus viral gastroenteritis versus colitis versus C. difficile.  Will give IV fluids, Zofran, Toradol.  Will check  labs to rule out significant dehydration, AKI, electrolyte abnormalities.  Will do C. difficile testing of her stool.  Will do a chest x-ray to rule out pneumonia.   Clinical Course as of Feb 17 1456  Thu Feb 17, 2019  1455 Labs showing leukocytosis in the setting of most likely viral gastroenteritis.  Remaining of her labs with no acute findings.  UA showing mild ketones consistent with mild dehydration but no evidence of UTI.  C. difficile negative.  Patient is tolerating p.o. with no further episodes of vomiting.  Discussed p.o. hydration, bland diet, Zofran for nausea, follow-up with primary care doctor. Discussed return precautions for signs of abdominal pain especially right lower quadrant consistent with appendicitis but at this time patient has no tenderness in the right lower quadrant.   [CV]    Clinical  Course User Index [CV] Don Perking [place], MD   _________________________ 2:57 PM on 02/17/2019 -----------------------------------------  Discussed with patient elevated glucose and recommended follow-up with primary care doctor for a fasting glucose test.  As part of my medical decision making, I reviewed the following data within the electronic MEDICAL RECORD NUMBER Nursing notes reviewed and incorporated, Labs reviewed , Old chart reviewed, Radiograph reviewed , Notes from prior ED visits and [place] Controlled Substance Database    Pertinent labs & imaging results that were available during my care of the patient were reviewed by me and considered in my medical decision making (see chart for details).    ____________________________________________   FINAL CLINICAL IMPRESSION(S) / ED DIAGNOSES  Final diagnoses:  Nausea vomiting and diarrhea  Gastroenteritis      NEW MEDICATIONS STARTED DURING THIS VISIT:  ED Discharge Orders         Ordered    ondansetron (ZOFRAN ODT) 4 MG disintegrating tablet  Every 8 hours PRN     02/17/19 1456           Note:  This document was prepared using Dragon voice recognition software and may include unintentional dictation errors.    Don Perking, [place], MD 02/17/19 1457    Nita Sickle, MD 02/17/19 [id number]

## 2019-08-10 ENCOUNTER — Other Ambulatory Visit: Payer: Self-pay

## 2019-08-10 ENCOUNTER — Emergency Department
Admission: EM | Admit: 2019-08-10 | Discharge: 2019-08-10 | Disposition: A | Discharge status: Home / Self Care | Payer: Self-pay | Attending: Emergency Medicine | Admitting: Emergency Medicine

## 2019-08-10 ENCOUNTER — Encounter: Payer: Self-pay | Admitting: Emergency Medicine

## 2019-08-10 DIAGNOSIS — Y939 Activity, unspecified: Secondary | ICD-10-CM | POA: Insufficient documentation

## 2019-08-10 DIAGNOSIS — Y929 Unspecified place or not applicable: Secondary | ICD-10-CM | POA: Insufficient documentation

## 2019-08-10 DIAGNOSIS — S025XXA Fracture of tooth (traumatic), initial encounter for closed fracture: Secondary | ICD-10-CM | POA: Insufficient documentation

## 2019-08-10 DIAGNOSIS — K0889 Other specified disorders of teeth and supporting structures: Secondary | ICD-10-CM

## 2019-08-10 DIAGNOSIS — J45909 Unspecified asthma, uncomplicated: Secondary | ICD-10-CM | POA: Insufficient documentation

## 2019-08-10 DIAGNOSIS — F17210 Nicotine dependence, cigarettes, uncomplicated: Secondary | ICD-10-CM | POA: Insufficient documentation

## 2019-08-10 DIAGNOSIS — Y998 Other external cause status: Secondary | ICD-10-CM | POA: Insufficient documentation

## 2019-08-10 DIAGNOSIS — X58XXXA Exposure to other specified factors, initial encounter: Secondary | ICD-10-CM | POA: Insufficient documentation

## 2019-08-10 DIAGNOSIS — K029 Dental caries, unspecified: Secondary | ICD-10-CM | POA: Insufficient documentation

## 2019-08-10 DIAGNOSIS — K047 Periapical abscess without sinus: Secondary | ICD-10-CM | POA: Insufficient documentation

## 2019-08-10 MED ORDER — IBUPROFEN 800 MG PO TABS
800.0000 mg | ORAL_TABLET | Freq: Once | ORAL | Status: AC
  Administered 2019-08-10: 22:00:00 800 mg via ORAL
  Filled 2019-08-10: qty 1

## 2019-08-10 MED ORDER — PENICILLIN V POTASSIUM 500 MG PO TABS
500.0000 mg | ORAL_TABLET | Freq: Once | ORAL | Status: AC
  Administered 2019-08-10: 22:00:00 500 mg via ORAL
  Filled 2019-08-10: qty 1

## 2019-08-10 MED ORDER — TRAMADOL HCL 50 MG PO TABS
100.0000 mg | ORAL_TABLET | Freq: Once | ORAL | Status: AC
  Administered 2019-08-10: 100 mg via ORAL
  Filled 2019-08-10: qty 2

## 2019-08-10 MED ORDER — PENICILLIN V POTASSIUM 500 MG PO TABS: 500.0000 mg | ORAL_TABLET | Freq: Four times a day (QID) | ORAL | 0 refills | Status: DC

## 2019-08-10 MED ORDER — TRAMADOL HCL 50 MG PO TABS: 50.0000 mg | ORAL_TABLET | Freq: Four times a day (QID) | ORAL | 0 refills | Status: AC | PRN

## 2019-08-10 NOTE — ED Triage Notes (Signed)
Pt presents to ED with tooth pain on the lower right side of her mouth for the past 3 days. Pt reports hx of the same but had that tooth pulled. Pt tearful in triage.

## 2019-08-10 NOTE — Discharge Instructions (Addendum)
Please take antibiotics as prescribed.  Take pain medications as needed for pain.  Return to ER for any fevers worsening symptoms or urgent changes in health.

## 2019-08-10 NOTE — ED Provider Notes (Signed)
[place] EMERGENCY DEPARTMENT Provider Note   CSN: [id number] Arrival date & time: 08/10/19  2037     History   Chief Complaint Chief Complaint  Patient presents with  . Dental Pain    HPI Jodi Tran is a 38 y.o. female presents to the emergency department for evaluation of dental pain.  Right lower dental pain and discomfort x2 days.  She has dental caries and broken teeth along the right lower jaw, teeth 34, 35 and 36.  Denies any fevers, difficulty swallowing or facial swelling.  No drainage.  She is tolerating p.o. well.     HPI  Past Medical History:  Diagnosis Date  . Asthma     There are no active problems to display for this patient.   Past Surgical History:  Procedure Laterality Date  . CHOLECYSTECTOMY       OB History   No obstetric history on file.      Home Medications    Prior to Admission medications   Medication Sig Start Date End Date Taking? Authorizing Provider  albuterol (PROVENTIL HFA;VENTOLIN HFA) 108 (90 Base) MCG/ACT inhaler Inhale 2 puffs into the lungs every 4 (four) hours as needed for wheezing.    [provider]  carisoprodol (SOMA) 350 MG tablet Take 1 tablet (350 mg total) by mouth 3 (three) times daily as needed. 08/08/18   Schaevitz, Randall An, MD  diclofenac sodium (VOLTAREN) 1 % GEL Apply to affected area as needed for pain 08/08/18   Orbie Pyo, MD  HYDROcodone-acetaminophen (NORCO) 5-325 MG tablet Take 1 tablet by mouth every 6 (six) hours as needed for severe pain. Patient not taking: Reported on 08/08/2018 03/29/18   Johnn Hai, PA-C  ibuprofen (ADVIL,MOTRIN) 600 MG tablet Take 1 tablet (600 mg total) by mouth every 8 (eight) hours as needed. Patient not taking: Reported on 08/08/2018 03/29/18   Letitia Neri L, PA-C  ondansetron (ZOFRAN ODT) 4 MG disintegrating tablet Take 1 tablet (4 mg total) by mouth every 8 (eight) hours as needed. 02/17/19   Rudene Re, MD   penicillin v potassium (VEETID) 500 MG tablet Take 1 tablet (500 mg total) by mouth 4 (four) times daily. 08/10/19   Duanne Guess, PA-C  traMADol (ULTRAM) 50 MG tablet Take 1 tablet (50 mg total) by mouth every 6 (six) hours as needed. 08/10/19 08/09/20  Duanne Guess, PA-C    Family History No family history on file.  Social History Social History   Tobacco Use  . Smoking status: Current Every Day Smoker    Packs/day: 0.50    Types: Cigarettes  . Smokeless tobacco: Never Used  Substance Use Topics  . Alcohol use: No  . Drug use: No     Allergies   Bactrim [sulfamethoxazole-trimethoprim]   Review of Systems Review of Systems  Constitutional: Negative.  Negative for chills and fever.  HENT: Positive for dental problem and facial swelling. Negative for drooling, mouth sores, trouble swallowing and voice change.   Respiratory: Negative for shortness of breath.   Cardiovascular: Negative for chest pain.  Gastrointestinal: Negative for nausea and vomiting.  Musculoskeletal: Negative for arthralgias, neck pain and neck stiffness.  Skin: Negative.   Psychiatric/Behavioral: Negative for confusion.  All other systems reviewed and are negative.    Physical Exam Updated Vital Signs BP 131/87 (BP Location: Left Arm)   Pulse 99   Temp 99.8 F (37.7 C) (Oral)   Resp 20   Ht 4\' 11"  (1.499  m)   Wt 78.5 kg   LMP 07/26/2019   SpO2 95%   BMI 34.94 kg/m   Physical Exam Constitutional:      General: She is not in acute distress.    Appearance: She is well-developed.  HENT:     Head: Normocephalic and atraumatic.     Jaw: No trismus.     Right Ear: External ear normal.     Left Ear: External ear normal.     Nose: Nose normal.     Mouth/Throat:     Mouth: No oral lesions.     Dentition: Normal dentition.     Pharynx: Uvula midline. No oropharyngeal exudate, posterior oropharyngeal erythema or uvula swelling.   Neck:     Musculoskeletal: Normal range of motion and  neck supple.  Cardiovascular:     Rate and Rhythm: Normal rate.     Heart sounds: Normal heart sounds. No murmur. No gallop.   Pulmonary:     Effort: Pulmonary effort is normal. No respiratory distress.     Breath sounds: Normal breath sounds.  Skin:    General: Skin is warm and dry.  Neurological:     Mental Status: She is alert and oriented to person, place, and time.  Psychiatric:        Behavior: Behavior normal.        Thought Content: Thought content normal.      ED Treatments / Results  Labs (all labs ordered are listed, but only abnormal results are displayed) Labs Reviewed - No data to display  EKG None  Radiology No results found.  Procedures Procedures (including critical care time)  Medications Ordered in ED Medications  traMADol (ULTRAM) tablet 100 mg (100 mg Oral Given 08/10/19 2143)  ibuprofen (ADVIL) tablet 800 mg (800 mg Oral Given 08/10/19 2143)  penicillin v potassium (VEETID) tablet 500 mg (500 mg Oral Given 08/10/19 2143)     Initial Impression / Assessment and Plan / ED Course  I have reviewed the triage vital signs and the nursing notes.  Pertinent labs & imaging results that were available during my care of the patient were reviewed by me and considered in my medical decision making (see chart for details).       38 year old female with dental pain, infection.  She is started on oral antibiotics and given tramadol and ibuprofen for pain.  She will follow-up with dental clinic.  She understands signs and symptoms return to ED for.  Final Clinical Impressions(s) / ED Diagnoses   Final diagnoses:  Dental caries  Pain, dental  Closed fracture of tooth, initial encounter  Dental infection    ED Discharge Orders         Ordered    penicillin v potassium (VEETID) 500 MG tablet  4 times daily     08/10/19 2138    traMADol (ULTRAM) 50 MG tablet  Every 6 hours PRN     08/10/19 2138           Ronnette JuniperGaines, Thomas C, PA-C 08/10/19 2158     Concha SeFunke, Mary E, MD 08/15/19 1459

## 2019-08-26 IMAGING — DX PORTABLE CHEST - 1 VIEW
1 series · 1 of 1 positions shown · non-contrast
Comparison: 08/08/2018

CLINICAL DATA: Cough.  Diarrhea, vomiting

EXAM:
PORTABLE CHEST 1 VIEW

[chest ap]
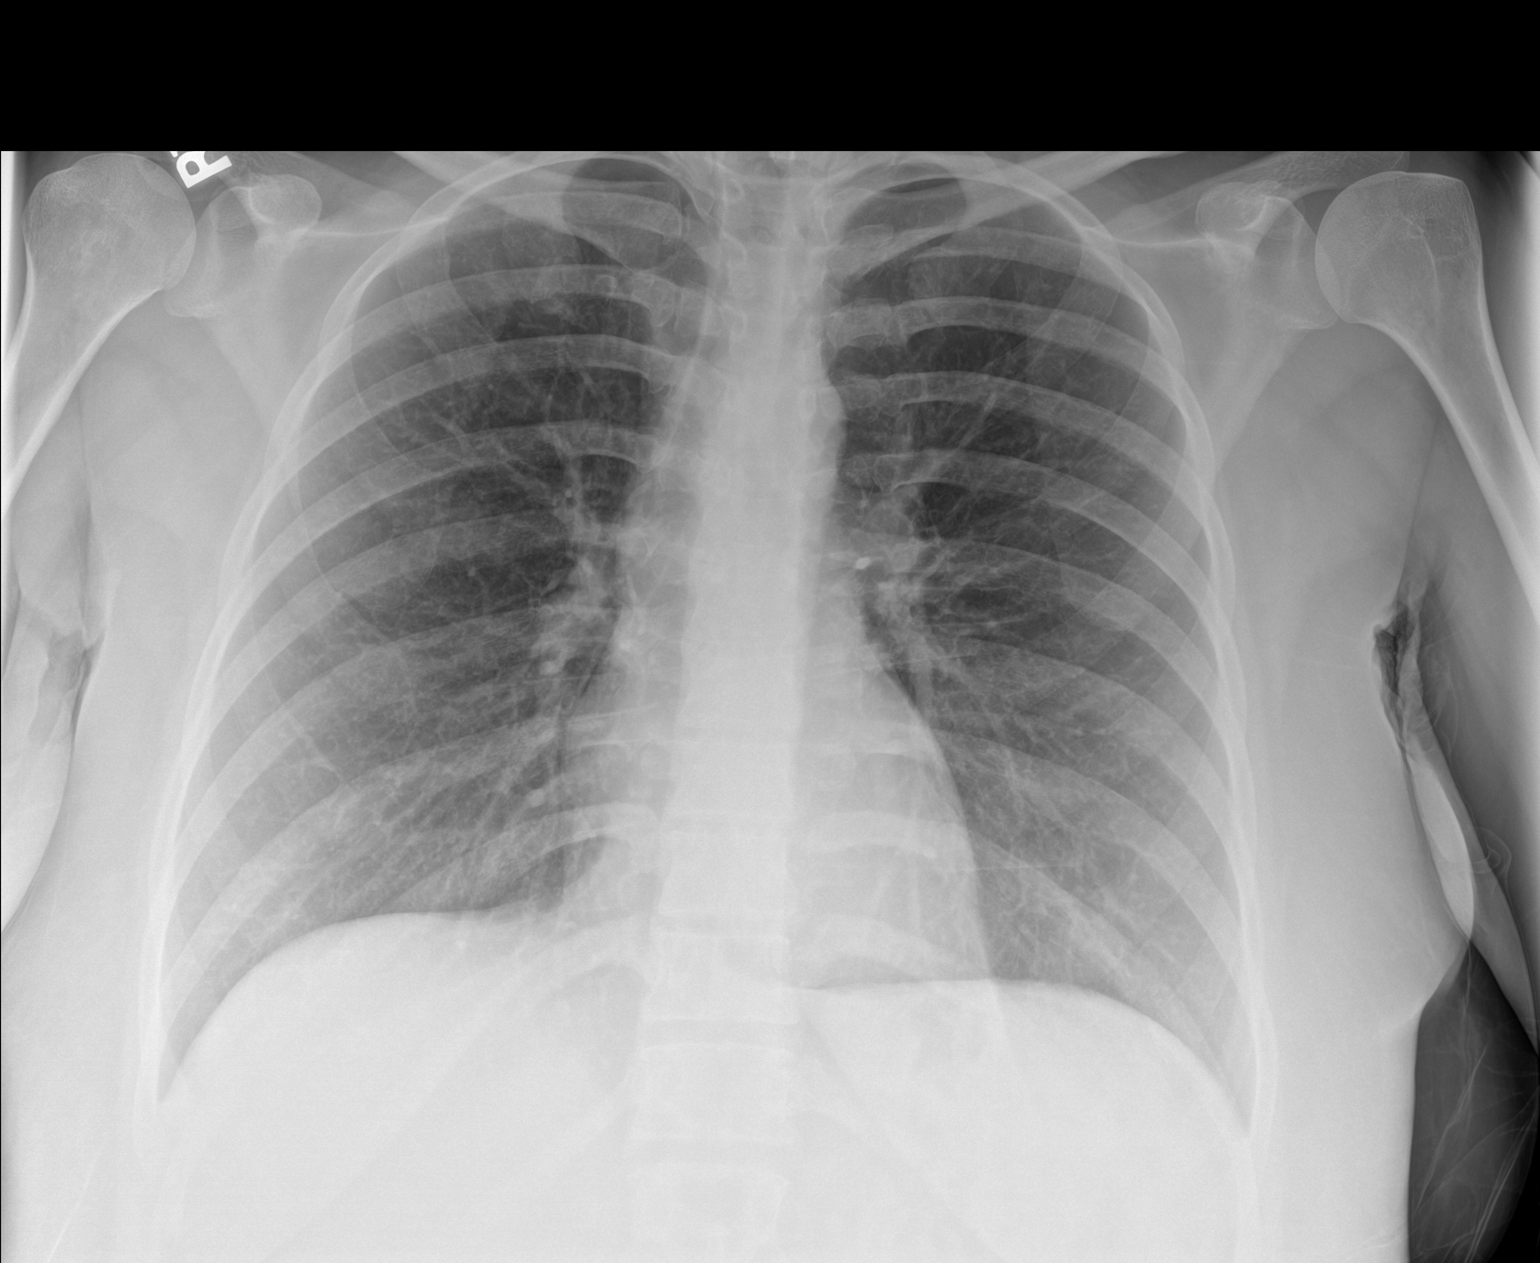

[1 of 1 positions shown; findings below may reference images not displayed]

FINDINGS: The heart size and mediastinal contours are within normal limits.
Both lungs are clear. The visualized skeletal structures are
unremarkable.
IMPRESSION: No active disease.

## 2022-01-17 ENCOUNTER — Encounter: Payer: Self-pay | Admitting: Emergency Medicine

## 2022-01-17 ENCOUNTER — Other Ambulatory Visit: Payer: Self-pay

## 2022-01-17 ENCOUNTER — Emergency Department
Admission: EM | Admit: 2022-01-17 | Discharge: 2022-01-17 | Disposition: A | Discharge status: Home / Self Care | Payer: Self-pay | Attending: Emergency Medicine | Admitting: Emergency Medicine

## 2022-01-17 DIAGNOSIS — K0889 Other specified disorders of teeth and supporting structures: Secondary | ICD-10-CM | POA: Insufficient documentation

## 2022-01-17 DIAGNOSIS — J45909 Unspecified asthma, uncomplicated: Secondary | ICD-10-CM | POA: Insufficient documentation

## 2022-01-17 MED ORDER — CHLORHEXIDINE GLUCONATE 0.12 % MT SOLN: 15.0000 mL | Freq: Two times a day (BID) | OROMUCOSAL | 0 refills | Status: AC

## 2022-01-17 MED ORDER — PENICILLIN V POTASSIUM 500 MG PO TABS: 500.0000 mg | ORAL_TABLET | Freq: Four times a day (QID) | ORAL | 0 refills | Status: AC

## 2022-01-17 MED ORDER — LIDOCAINE-EPINEPHRINE 2 %-1:100000 IJ SOLN
1.7000 mL | Freq: Once | INTRAMUSCULAR | Status: DC
  Filled 2022-01-17: qty 1.7

## 2022-01-17 NOTE — Discharge Instructions (Addendum)
-  Please take all your antibiotics as prescribed. -Treat pain with Tylenol/ibuprofen as needed for pain. -Rinse daily with chlorhexidine wash. -Follow-up with your dental provider, as discussed. -Return to the emergency department at any time if you begin to experience any new or worsening symptoms

## 2022-01-17 NOTE — ED Provider Notes (Signed)
[facility] Provider Note    Event Date/Time   First MD Initiated Contact with Patient 01/17/22 1519     (approximate)   History   Chief Complaint Dental Pain   HPI Jodi Tran is a 41 y.o. female, history of asthma, presents to the emergency department for evaluation of dental pain.  Patient states that she has been experiencing pain along her lower right teeth, along the gum/jawline.  Pain radiates into her right ear.  She says this pain has been going on for the past 3 days.  She states that she has a significant dental history with multiple dental fractures due to reported calcium issues.  She was told that she needed to have several teeth removed, but her next appointment is not for another 2 months.  Patient states that she has been treating with clove oil, with moderate relief.  However, her pain still persists. Denies fevers, neck pain, headache, chest pain, shortness of breath, rashes, blurry vision, difficulty swallowing, hearing changes, or nausea/vomiting  History Limitations: No limitations.      Physical Exam  Triage Vital Signs: ED Triage Vitals  Enc Vitals Group     BP 01/17/22 1510 135/84     Pulse Rate 01/17/22 1510 86     Resp 01/17/22 1510 19     Temp 01/17/22 1510 99 F (37.2 C)     Temp Source 01/17/22 1510 Oral     SpO2 01/17/22 1510 96 %     Weight 01/17/22 1504 173 lb 1 oz (78.5 kg)     Height 01/17/22 1504 4\' 11"  (1.499 m)     Head Circumference --      Peak Flow --      Pain Score 01/17/22 1504 9     Pain Loc --      Pain Edu? --      Excl. in [place]? --     Most recent vital signs: Vitals:   01/17/22 1510  BP: 135/84  Pulse: 86  Resp: 19  Temp: 99 F (37.2 C)  SpO2: 96%    General: Awake, NAD.  CV: Good peripheral perfusion.  Resp: Normal effort.  Abd: Soft, non-tender. No distention.  Neuro: At baseline. No gross neurological deficits. Other: Several dental caries scattered across her teeth, particularly  worse in the bottom right.  Multiple dental fractures present.  Gumline erythematous, though no evidence of abscesses.  No mandibular or preauricular lymphadenopathy present.  Physical Exam    ED Results / Procedures / Treatments  Labs (all labs ordered are listed, but only abnormal results are displayed) Labs Reviewed - No data to display   EKG Not applicable.   RADIOLOGY  ED Provider Interpretation: Not applicable.  No results found.  PROCEDURES:  Critical Care performed: None.  Procedures    MEDICATIONS ORDERED IN ED: Medications  lidocaine-EPINEPHrine (XYLOCAINE W/EPI) 2 %-1:100000 (with pres) injection 1.7 mL (1.7 mLs Infiltration Not Given 01/17/22 1625)     IMPRESSION / MDM / ASSESSMENT AND PLAN / ED COURSE  I reviewed the triage vital signs and the nursing notes.                              Jodi Tran is a 41 y.o. female, history of asthma, presents to the emergency department for evaluation of dental pain.  Patient states that she has been experiencing pain along her lower right teeth, along the gum/jawline.  Pain  radiates into her right ear.  She says this pain has been going on for the past 3 days.  She states that she has a significant dental history with multiple dental fractures due to reported calcium issues.  She was told that she needed to have several teeth removed, but her next appointment is not for another 2 months.  Patient states that she has been treating with clove oil, with moderate relief.  However, her pain still persists.  Differential diagnosis includes, but is not limited to, dental caries, dental fracture, dental abscess, pulpitis, pericoronitis,    ED Course Patient appears uncomfortable, but stable.  Vital signs within normal limits.  Patient currently endorsing significant pain in the right lower teeth.  We will go ahead and perform inferior alveolar nerve block.  Patient experienced immediate relief from the nerve block.  She  currently states that she is not in any more pain.  Assessment/Plan History and physical exam consistent with severe dental caries with possible dental infection/pulpitis.  I do not suspect any serious or life-threatening systemic infection.  We will plan to discharge this patient with a prescription for penicillin VK and chlorhexidine rinses.  Advised her to follow-up with her dental provider.  Patient was provided with anticipatory guidance, return precautions, and educational material. Encouraged the patient to return to the emergency department at any time if they begin to experience any new or worsening symptoms.       FINAL CLINICAL IMPRESSION(S) / ED DIAGNOSES   Final diagnoses:  Pain, dental     Rx / DC Orders   ED Discharge Orders          Ordered    penicillin v potassium (VEETID) 500 MG tablet  4 times daily        01/17/22 1620    chlorhexidine (PERIDEX) 0.12 % solution  2 times daily        01/17/22 1620             Note:  This document was prepared using Dragon voice recognition software and may include unintentional dictation errors.   Teodoro Spray, [place] 01/17/22 1627    Lucrezia Starch, MD 01/17/22 2139

## 2022-01-17 NOTE — ED Triage Notes (Signed)
Pt comes into the ED via POV c/o right lower dental pain.  Pt states the pain is now going up into the ear.  Pt has mild swelling noted to the jaw as well.  Pt in NAD.

## 2022-03-28 ENCOUNTER — Emergency Department
Admission: EM | Admit: 2022-03-28 | Discharge: 2022-03-28 | Disposition: A | Discharge status: Home / Self Care | Payer: Self-pay | Attending: Emergency Medicine | Admitting: Emergency Medicine

## 2022-03-28 ENCOUNTER — Other Ambulatory Visit: Payer: Self-pay

## 2022-03-28 DIAGNOSIS — X58XXXA Exposure to other specified factors, initial encounter: Secondary | ICD-10-CM | POA: Insufficient documentation

## 2022-03-28 DIAGNOSIS — S025XXB Fracture of tooth (traumatic), initial encounter for open fracture: Secondary | ICD-10-CM | POA: Insufficient documentation

## 2022-03-28 DIAGNOSIS — K029 Dental caries, unspecified: Secondary | ICD-10-CM | POA: Insufficient documentation

## 2022-03-28 MED ORDER — AMOXICILLIN 500 MG PO CAPS
500.0000 mg | ORAL_CAPSULE | Freq: Once | ORAL | Status: AC
  Administered 2022-03-28: 500 mg via ORAL
  Filled 2022-03-28: qty 1

## 2022-03-28 MED ORDER — HYDROCODONE-ACETAMINOPHEN 5-325 MG PO TABS
1.0000 | ORAL_TABLET | Freq: Once | ORAL | Status: AC
  Administered 2022-03-28: 1 via ORAL
  Filled 2022-03-28: qty 1

## 2022-03-28 MED ORDER — AMOXICILLIN 500 MG PO CAPS: 500.0000 mg | ORAL_CAPSULE | Freq: Three times a day (TID) | ORAL | 0 refills | Status: DC

## 2022-03-28 NOTE — ED Provider Notes (Signed)
? ? ?[facility] ?Emergency Department Provider Note ? ? ? ? Event Date/Time  ? First MD Initiated Contact with Patient 03/28/22 2135   ?  (approximate) ? ? ?History  ? ?Dental Pain ? ? ?HPI ? ?Jodi Tran is a 41 y.o. female with a history of asthma presents to the ED with complaints of dental pain.  Patient presents with pain to the lower left first molar for the last 2 to 3 days.  Patient presents with generalized arrested dental caries and multiple teeth that are fractured and decayed.  She denies any recent fevers, chills, or sweats patient denies difficulty breathing, swallowing, or controlling oral secretions.  She reports has been using Motrin and clove oil at home with limited benefit. ?  ? ? ?Physical Exam  ? ?Triage Vital Signs: ?ED Triage Vitals  ?Enc Vitals Group  ?   BP 03/28/22 2101 (!) 138/109  ?   Pulse Rate 03/28/22 2101 89  ?   Resp 03/28/22 2101 15  ?   Temp 03/28/22 2101 98.4 ?F (36.9 ?C)  ?   Temp Source 03/28/22 2101 Oral  ?   SpO2 03/28/22 2101 95 %  ?   Weight 03/28/22 2114 170 lb (77.1 kg)  ?   Height 03/28/22 2114 4\' 11"  (1.499 m)  ?   Head Circumference --   ?   Peak Flow --   ?   Pain Score 03/28/22 2114 10  ?   Pain Loc --   ?   Pain Edu? --   ?   Excl. in [place]? --   ? ? ?Most recent vital signs: ?Vitals:  ? 03/28/22 2101  ?BP: (!) 138/109  ?Pulse: 89  ?Resp: 15  ?Temp: 98.4 ?F (36.9 ?C)  ?SpO2: 95%  ? ? ?General Awake, no distress.  ?HEENT NCAT. PERRL. EOMI. No rhinorrhea. Mucous membranes are moist.  Uvula is midline and tonsils are flat.  No oropharyngeal lesions appreciated.  No brawny sublingual edema noted.  The LL 1st molar, the source tooth is broken with exposed dentin and local gingivitis ?CV:  Good peripheral perfusion.  ?RESP:  Normal effort.  ?ABD:  No distention.  ? ? ?ED Results / Procedures / Treatments  ? ?Labs ?(all labs ordered are listed, but only abnormal results are displayed) ?Labs Reviewed - No data to  display ? ? ?EKG ? ? ? ?RADIOLOGY ? ? ?No results found. ? ? ?PROCEDURES: ? ?Critical Care performed: No ? ?Procedures ? ? ?MEDICATIONS ORDERED IN ED: ?Medications  ?HYDROcodone-acetaminophen (NORCO/VICODIN) 5-325 MG per tablet 1 tablet (has no administration in time range)  ?amoxicillin (AMOXIL) capsule 500 mg (has no administration in time range)  ? ? ? ?IMPRESSION / MDM / ASSESSMENT AND PLAN / ED COURSE  ?I reviewed the triage vital signs and the nursing notes. ?             ?               ? ?Differential diagnosis includes, but is not limited to, dental caries, dental abscess, necrotizing gingivitis dental fracture, Ludwig's angina ? ?Patient to the ED with acute on chronic dental pain.  Patient presents with severe tooth decay and arrested dental caries throughout.  No signs of an acute oral infection at this time.  Patient's diagnosis is consistent with dental pain secondary to dental caries. Patient will be discharged home with prescriptions for amoxicillin. Patient is to follow up with the number of  local dental  providers as needed or otherwise directed. Patient is given ED precautions to return to the ED for any worsening or new symptoms. ? ? ?FINAL CLINICAL IMPRESSION(S) / ED DIAGNOSES  ? ?Final diagnoses:  ?Dental caries  ?Pain due to dental caries  ?Open fracture of tooth, initial encounter  ? ? ? ?Rx / DC Orders  ? ?ED Discharge Orders   ? ?      Ordered  ?  amoxicillin (AMOXIL) 500 MG capsule  3 times daily       ? 03/28/22 2232  ? ?  ?  ? ?  ? ? ? ?Note:  This document was prepared using Dragon voice recognition software and may include unintentional dictation errors. ? ?  ?Melvenia Needles, PA-C ?03/28/22 2234 ? ?  ?Naaman Plummer, MD ?03/30/22 2109 ? ?

## 2022-03-28 NOTE — ED Triage Notes (Signed)
Presents to ED with toothache. (L)lower pain x 2-3 days. Broken. Denies fevers at home. No help with motrin, clove oil.  ?

## 2022-03-28 NOTE — Discharge Instructions (Signed)
Take antibiotics as directed.  Follow-up with one of the dental providers on this below. ? ?OPTIONS FOR DENTAL FOLLOW UP CARE ? ?[place] [facility] and [facility] Services - Local Safety Net Dental Clinics ?[email or web address] ?  ?[facility] [phone]) ? ?[employer] [phone]) ? ?[place] [phone]) ? ?[facility] [phone]) ? ?[facility] [phone]) ?This clinic caters to the indigent population and is on a lottery system. ?Location: ?[employer] [facility], [employer], [address], [place] ?Clinic Hours: ?Wednesdays from 6pm - 9pm, patients seen by a lottery system. ?For dates, call or go to [email or web address] ?Services: ?Cleanings, fillings and simple extractions. ?Payment Options: ?DENTAL WORK IS FREE OF CHARGE. Bring proof of income or support. ?Best way to get seen: ?Arrive at 5:15 pm - this is a lottery, NOT first come/first serve, so arriving earlier will not increase your chances of being seen. ?  ?  ?[facility] ?[phone] ?Select option 1 for emergencies ?  ?Location: ?[employer] [facility], [employer], [address] [place] ?Clinic Hours: ?No walk-ins accepted - call the day before to schedule an appointment. ?Check in times are 9:30 am and 1:30 pm. ?Services: ?Simple extractions, temporary fillings, pulpectomy/pulp debridement, uncomplicated abscess drainage. ?Payment Options: ?PAYMENT IS DUE AT THE TIME OF SERVICE.  Fee is usually $100-200, additional surgical procedures (e.g. abscess drainage) may be extra. ?Cash, checks, Visa/MasterCard accepted.  Can file Medicaid if patient is covered for dental - patient should call case worker to check. ?No discount for [facility] patients. ?Best way to get seen: ?MUST call the day before and get onto the schedule. Can usually be seen the next  1-2 days. No walk-ins accepted. ?  ?  ?[facility] ?[phone] ?  ?Location: ?[facility], [address] [place] ?Clinic Hours: ?M, W, Th, F 8am or 1:30pm, Tues 9a or 1:30 - first come/first served. ?Services: ?Simple extractions, temporary fillings, uncomplicated abscess drainage.  You do not need to be an [facility] resident. ?Payment Options: ?PAYMENT IS DUE AT THE TIME OF SERVICE. ?Dental insurance, otherwise sliding scale - bring proof of income or support. ?Depending on income and treatment needed, cost is usually $50-200. ?Best way to get seen: ?Arrive early as it is first come/first served. ?  ?  ?[facility] ?[phone] ?  ?Location: ?[address] ?Clinic Hours: ?Mon-Thu 8a-5p ?Services: ?Most basic dental services including extractions and fillings. ?Payment Options: ?PAYMENT IS DUE AT THE TIME OF SERVICE. ?Sliding scale, up to 50% off - bring proof if income or support. ?Medicaid with dental option accepted. ?Best way to get seen: ?Call to schedule an appointment, can usually be seen within 2 weeks OR they will try to see walk-ins - show up at 8a or 2p (you may have to wait). ?  ?  ?[facility] ?[phone] ?[place] RESIDENTS ONLY ?  ?Location: ?[facility] [employer], [address] [place], [place] [address] ?Clinic Hours: By appointment only. ?Monday - Thursday 8am-5pm, Friday 8am-12pm ?Services: Cleanings, fillings, extractions. ?Payment Options: ?PAYMENT IS DUE AT THE TIME OF SERVICE. ?Cash, Visa or MasterCard. Sliding scale - $30 minimum per service. ?Best way to get seen: ?Come in to office, complete packet and make an appointment - need proof of income ?or support monies for each household member and proof of [facility] residence. ?Usually takes about a month to get in. ?  ?  ?  [facility] ?[phone] ?  ?Location: ?[address]  [facility] ?Clinic Hours: Walk-in Urgent Care Dental Services are offered Monday-Friday mornings only. ?The numbers of emergencies accepted daily is limited to the number of ?providers available. ?Maximum 15 - Mondays, Wednesdays & Thursdays ?Maximum 10 - Tuesdays & Fridays ?Services: ?You do not need to be a [facility] resident to be seen for a dental emergency. ?Emergencies are defined as pain, swelling, abnormal bleeding, or dental trauma. Walkins will receive x-rays if needed. ?NOTE: Dental cleaning is not an emergency. ?Payment Options: ?PAYMENT IS DUE AT THE TIME OF SERVICE. ?Minimum co-pay is $40.00 for uninsured patients. ?Minimum co-pay is $3.00 for Medicaid with dental coverage. ?Dental Insurance is accepted and must be presented at time of visit. ?Medicare does not cover dental. ?Forms of payment: Cash, credit card, checks. ?Best way to get seen: ?If not previously registered with the clinic, walk-in dental registration begins at 7:15 am and is on a first come/first serve basis. ?If previously registered with the clinic, call to make an appointment. ?  ?  ?[facility] ?[phone] ?[place] RESIDENTS ONLY ?  ?Location: ?[address], [place], [place] ?Clinic Hours: ?Mon-Thu 10a-2p ?Services: Extractions only! ?Payment Options: ?FREE (donations accepted) - bring proof of income or support ?Best way to get seen: ?Call and schedule an appointment OR come at 8am on the 1st Monday of every month (except for holidays) when it is first come/first served. ?  ?  ?[facility] ?[phone] ?  ?Location: ?[address] [place] ?Clinic Hours: ?Friday mornings ?Services, Payment Options, Best way to get seen: ?Call for info ? ?

## 2023-02-18 ENCOUNTER — Other Ambulatory Visit: Payer: Self-pay

## 2023-02-18 ENCOUNTER — Emergency Department: Payer: 59

## 2023-02-18 ENCOUNTER — Encounter: Payer: Self-pay | Admitting: Internal Medicine

## 2023-02-18 ENCOUNTER — Inpatient Hospital Stay
Admission: EM | Admit: 2023-02-18 | Discharge: 2023-02-23 | DRG: 872 | Disposition: A | Discharge status: Home / Self Care | Payer: 59 | Attending: Internal Medicine | Admitting: Internal Medicine

## 2023-02-18 DIAGNOSIS — R112 Nausea with vomiting, unspecified: Secondary | ICD-10-CM | POA: Diagnosis present

## 2023-02-18 DIAGNOSIS — N61 Mastitis without abscess: Secondary | ICD-10-CM | POA: Diagnosis not present

## 2023-02-18 DIAGNOSIS — L0390 Cellulitis, unspecified: Secondary | ICD-10-CM

## 2023-02-18 DIAGNOSIS — E876 Hypokalemia: Secondary | ICD-10-CM | POA: Diagnosis present

## 2023-02-18 DIAGNOSIS — N644 Mastodynia: Secondary | ICD-10-CM

## 2023-02-18 DIAGNOSIS — Z79899 Other long term (current) drug therapy: Secondary | ICD-10-CM

## 2023-02-18 DIAGNOSIS — A419 Sepsis, unspecified organism: Principal | ICD-10-CM | POA: Diagnosis present

## 2023-02-18 DIAGNOSIS — N611 Abscess of the breast and nipple: Secondary | ICD-10-CM | POA: Diagnosis present

## 2023-02-18 DIAGNOSIS — Z87891 Personal history of nicotine dependence: Secondary | ICD-10-CM

## 2023-02-18 DIAGNOSIS — F1990 Other psychoactive substance use, unspecified, uncomplicated: Secondary | ICD-10-CM | POA: Diagnosis present

## 2023-02-18 DIAGNOSIS — J45909 Unspecified asthma, uncomplicated: Secondary | ICD-10-CM | POA: Insufficient documentation

## 2023-02-18 DIAGNOSIS — Z882 Allergy status to sulfonamides status: Secondary | ICD-10-CM

## 2023-02-18 DIAGNOSIS — B9562 Methicillin resistant Staphylococcus aureus infection as the cause of diseases classified elsewhere: Secondary | ICD-10-CM | POA: Diagnosis present

## 2023-02-18 DIAGNOSIS — R5381 Other malaise: Secondary | ICD-10-CM | POA: Diagnosis present

## 2023-02-18 DIAGNOSIS — Z720 Tobacco use: Secondary | ICD-10-CM | POA: Insufficient documentation

## 2023-02-18 DIAGNOSIS — Z9049 Acquired absence of other specified parts of digestive tract: Secondary | ICD-10-CM

## 2023-02-18 LAB — URINALYSIS, ROUTINE W REFLEX MICROSCOPIC
Bilirubin Urine: NEGATIVE
Glucose, UA: NEGATIVE mg/dL
Ketones, ur: 20 mg/dL — AB
Leukocytes,Ua: NEGATIVE
Nitrite: NEGATIVE
Protein, ur: NEGATIVE mg/dL
Specific Gravity, Urine: 1.011 (ref 1.005–1.030)
pH: 5 (ref 5.0–8.0)

## 2023-02-18 LAB — COMPREHENSIVE METABOLIC PANEL
ALT: 11 U/L (ref 0–44)
AST: 17 U/L (ref 15–41)
Albumin: 3.5 g/dL (ref 3.5–5.0)
Alkaline Phosphatase: 87 U/L (ref 38–126)
Anion gap: 9 (ref 5–15)
BUN: 6 mg/dL (ref 6–20)
CO2: 22 mmol/L (ref 22–32)
Calcium: 8.2 mg/dL — ABNORMAL LOW (ref 8.9–10.3)
Chloride: 102 mmol/L (ref 98–111)
Creatinine, Ser: 0.66 mg/dL (ref 0.44–1.00)
GFR, Estimated: >60 mL/min (ref >60)
Glucose, Bld: 101 mg/dL — ABNORMAL HIGH (ref 70–99)
Potassium: 3.2 mmol/L — ABNORMAL LOW (ref 3.5–5.1)
Sodium: 133 mmol/L — ABNORMAL LOW (ref 135–145)
Total Bilirubin: 1.2 mg/dL (ref 0.3–1.2)
Total Protein: 7 g/dL (ref 6.5–8.1)

## 2023-02-18 LAB — CBC WITH DIFFERENTIAL/PLATELET
Abs Immature Granulocytes: 0.13 10*3/uL — ABNORMAL HIGH (ref 0.00–0.07)
Basophils Absolute: 0.1 10*3/uL (ref 0.0–0.1)
Basophils Relative: 1 %
Eosinophils Absolute: 0.1 10*3/uL (ref 0.0–0.5)
Eosinophils Relative: 0 %
HCT: 44.3 % (ref 36.0–46.0)
Hemoglobin: 14.4 g/dL (ref 12.0–15.0)
Immature Granulocytes: 1 %
Lymphocytes Relative: 7 %
Lymphs Abs: 1.6 10*3/uL (ref 0.7–4.0)
MCH: 27.1 pg (ref 26.0–34.0)
MCHC: 32.5 g/dL (ref 30.0–36.0)
MCV: 83.3 fL (ref 80.0–100.0)
Monocytes Absolute: 1.8 10*3/uL — ABNORMAL HIGH (ref 0.1–1.0)
Monocytes Relative: 8 %
Neutro Abs: 17.9 10*3/uL — ABNORMAL HIGH (ref 1.7–7.7)
Neutrophils Relative %: 83 %
Platelets: 349 10*3/uL (ref 150–400)
RBC: 5.32 MIL/uL — ABNORMAL HIGH (ref 3.87–5.11)
RDW: 14.4 % (ref 11.5–15.5)
WBC: 21.5 10*3/uL — ABNORMAL HIGH (ref 4.0–10.5)
nRBC: 0 % (ref 0.0–0.2)

## 2023-02-18 LAB — URINE DRUG SCREEN, QUALITATIVE (ARMC ONLY)
Amphetamines, Ur Screen: NOT DETECTED
Barbiturates, Ur Screen: NOT DETECTED
Benzodiazepine, Ur Scrn: NOT DETECTED
Cannabinoid 50 Ng, Ur ~~LOC~~: POSITIVE — AB
Cocaine Metabolite,Ur ~~LOC~~: POSITIVE — AB
MDMA (Ecstasy)Ur Screen: NOT DETECTED
Methadone Scn, Ur: NOT DETECTED
Opiate, Ur Screen: POSITIVE — AB
Phencyclidine (PCP) Ur S: NOT DETECTED
Tricyclic, Ur Screen: NOT DETECTED

## 2023-02-18 LAB — LACTIC ACID, PLASMA
Lactic Acid, Venous: 0.9 mmol/L (ref 0.5–1.9)
Lactic Acid, Venous: 0.7 mmol/L (ref 0.5–1.9)

## 2023-02-18 LAB — PREGNANCY, URINE: Preg Test, Ur: NEGATIVE

## 2023-02-18 MED ORDER — ONDANSETRON HCL 4 MG/2ML IJ SOLN
4.0000 mg | Freq: Once | INTRAMUSCULAR | Status: AC
  Administered 2023-02-18: 4 mg via INTRAVENOUS
  Filled 2023-02-18: qty 2

## 2023-02-18 MED ORDER — ACETAMINOPHEN 325 MG PO TABS: 650.0000 mg | ORAL_TABLET | Freq: Four times a day (QID) | ORAL | Status: DC | PRN

## 2023-02-18 MED ORDER — ADULT MULTIVITAMIN W/MINERALS CH
1.0000 | ORAL_TABLET | Freq: Every day | ORAL | Status: DC
  Administered 2023-02-18 – 2023-02-23 (×6): 1 via ORAL
  Filled 2023-02-18 (×6): qty 1

## 2023-02-18 MED ORDER — OXYCODONE HCL 5 MG PO TABS
5.0000 mg | ORAL_TABLET | ORAL | Status: DC | PRN
  Filled 2023-02-18: qty 1

## 2023-02-18 MED ORDER — POTASSIUM CHLORIDE 2 MEQ/ML IV SOLN
INTRAVENOUS | Status: DC
  Filled 2023-02-18 (×12): qty 1000

## 2023-02-18 MED ORDER — ENOXAPARIN SODIUM 40 MG/0.4ML IJ SOSY
40.0000 mg | PREFILLED_SYRINGE | INTRAMUSCULAR | Status: DC
  Administered 2023-02-18 – 2023-02-22 (×3): 40 mg via SUBCUTANEOUS
  Filled 2023-02-18 (×3): qty 0.4

## 2023-02-18 MED ORDER — ALBUTEROL SULFATE (2.5 MG/3ML) 0.083% IN NEBU: 2.5000 mg | INHALATION_SOLUTION | RESPIRATORY_TRACT | Status: DC | PRN

## 2023-02-18 MED ORDER — METOCLOPRAMIDE HCL 5 MG/ML IJ SOLN
10.0000 mg | Freq: Once | INTRAMUSCULAR | Status: AC
  Administered 2023-02-18: 10 mg via INTRAVENOUS
  Filled 2023-02-18: qty 2

## 2023-02-18 MED ORDER — FAMOTIDINE IN NACL 20-0.9 MG/50ML-% IV SOLN
20.0000 mg | Freq: Once | INTRAVENOUS | Status: AC
  Administered 2023-02-18: 20 mg via INTRAVENOUS
  Filled 2023-02-18: qty 50

## 2023-02-18 MED ORDER — KETOROLAC TROMETHAMINE 15 MG/ML IJ SOLN
15.0000 mg | Freq: Four times a day (QID) | INTRAMUSCULAR | Status: DC
  Administered 2023-02-18 – 2023-02-23 (×18): 15 mg via INTRAVENOUS
  Filled 2023-02-18 (×18): qty 1

## 2023-02-18 MED ORDER — SODIUM CHLORIDE 0.9 % IV BOLUS
1000.0000 mL | Freq: Once | INTRAVENOUS | Status: AC
  Administered 2023-02-18: 1000 mL via INTRAVENOUS

## 2023-02-18 MED ORDER — ACETAMINOPHEN 650 MG RE SUPP: 650.0000 mg | Freq: Four times a day (QID) | RECTAL | Status: DC | PRN

## 2023-02-18 MED ORDER — TRAZODONE HCL 50 MG PO TABS
25.0000 mg | ORAL_TABLET | Freq: Every evening | ORAL | Status: DC | PRN
  Administered 2023-02-21: 25 mg via ORAL
  Filled 2023-02-18 (×2): qty 1

## 2023-02-18 MED ORDER — KETOROLAC TROMETHAMINE 30 MG/ML IJ SOLN: 30.0000 mg | Freq: Four times a day (QID) | INTRAMUSCULAR | Status: DC | PRN

## 2023-02-18 MED ORDER — ONDANSETRON HCL 4 MG PO TABS: 4.0000 mg | ORAL_TABLET | Freq: Four times a day (QID) | ORAL | Status: DC | PRN

## 2023-02-18 MED ORDER — ONDANSETRON HCL 4 MG/2ML IJ SOLN: 4.0000 mg | Freq: Four times a day (QID) | INTRAMUSCULAR | Status: DC | PRN

## 2023-02-18 MED ORDER — SODIUM CHLORIDE 0.9 % IV SOLN
2.0000 g | Freq: Once | INTRAVENOUS | Status: AC
  Administered 2023-02-18: 2 g via INTRAVENOUS
  Filled 2023-02-18: qty 20

## 2023-02-18 MED ORDER — MORPHINE SULFATE (PF) 4 MG/ML IV SOLN
4.0000 mg | Freq: Once | INTRAVENOUS | Status: AC
  Administered 2023-02-18: 4 mg via INTRAVENOUS
  Filled 2023-02-18: qty 1

## 2023-02-18 MED ORDER — HYDRALAZINE HCL 20 MG/ML IJ SOLN: 10.0000 mg | INTRAMUSCULAR | Status: DC | PRN

## 2023-02-18 NOTE — H&P (Addendum)
History and Physical    Jodi Tran DOB: 05-04-1981 DOA: 02/18/2023  PCP: Pcp, No (Confirm with patient/family/NH records and if not entered, this has to be entered at [facility] point of entry) Patient coming from: Home  I have personally briefly reviewed patient's old medical records in [place]  Chief Complaint: Left breast pain  HPI: Jodi Tran is a 42 y.o. female with medical history significant of asthma unknown severity, tobacco use who presents the ED with a 2 to 3-day history of left breast pain associated with weakness, malaise, poor p.o. intake.  Patient states no history of trauma to the breast.  On presentation the patient has signs and symptoms consistent with sepsis.  She has tachycardia, leukocytosis to 21.5.  Breast ultrasound performed in ED.  Results discussed between radiologist and EDP.  No clear breast abscess however ultrasonographic findings most consistent with acute mastitis.  On my evaluation patient is resting in bed.  She is uncomfortable appearing.  Her main complaint is nausea and left breast pain.  Vital signs stable apart from sinus tachycardia.  ED Course: Left breast ultrasound performed.  Discussed between radiology and EDP.  Presentation consistent with acute mastitis.  Patient was given 2 g of IV Rocephin, IV fluids, pain management.  Hospitalist contacted for admission.  Review of Systems: As per HPI otherwise 14 point review of systems negative.   Past Medical History:  Diagnosis Date   Asthma     Past Surgical History:  Procedure Laterality Date   CHOLECYSTECTOMY       reports that she has been smoking cigarettes. She has been smoking an average of .5 packs per day. She has never used smokeless tobacco. She reports that she does not drink alcohol and does not use drugs.  Allergies  Allergen Reactions   Bactrim [Sulfamethoxazole-Trimethoprim] Shortness Of Breath    No family history on file. No family history of  mastitis  Prior to Admission medications   Medication Sig Start Date End Date Taking? Authorizing Provider  albuterol (PROVENTIL HFA;VENTOLIN HFA) 108 (90 Base) MCG/ACT inhaler Inhale 2 puffs into the lungs every 4 (four) hours as needed for wheezing.    [provider]  amoxicillin (AMOXIL) 500 MG capsule Take 1 capsule (500 mg total) by mouth 3 (three) times daily. 03/28/22   Menshew, Dannielle Karvonen, PA-C  carisoprodol (SOMA) 350 MG tablet Take 1 tablet (350 mg total) by mouth 3 (three) times daily as needed. 08/08/18   Schaevitz, Randall An, MD  chlorhexidine (PERIDEX) 0.12 % solution Use as directed 15 mLs in the mouth or throat 2 (two) times daily. 01/17/22   Teodoro Spray, PA  diclofenac sodium (VOLTAREN) 1 % GEL Apply to affected area as needed for pain 08/08/18   Orbie Pyo, MD  ondansetron (ZOFRAN ODT) 4 MG disintegrating tablet Take 1 tablet (4 mg total) by mouth every 8 (eight) hours as needed. 02/17/19   Rudene Re, MD    Physical Exam: Vitals:   02/18/23 1453 02/18/23 1456  BP: (!) 128/94   Pulse: (!) 119   Resp: 19   Temp: 99.9 F (37.7 C)   TempSrc: Oral   Weight:  68.5 kg  Height:  4\' 11"  (1.499 m)    Vitals:   02/18/23 1453 02/18/23 1456  BP: (!) 128/94   Pulse: (!) 119   Resp: 19   Temp: 99.9 F (37.7 C)   TempSrc: Oral   Weight:  68.5 kg  Height:  4'  11" (1.499 m)   General: Appears uncomfortable HEENT: Poor dentition, normocephalic, atraumatic Neck, supple, trachea midline, no tenderness Heart: Tachycardic, regular rhythm, no murmurs, no pedal edema Lungs: Clear to auscultation bilaterally, no adventitious sounds, normal work of breathing Abdomen: Soft, nontender, nondistended, positive bowel sounds Extremities: Normal, atraumatic, no clubbing or cyanosis, normal muscle tone Skin: Left breast lateral aspect painful indurated lesion, tender to touch Neurologic: Cranial nerves grossly intact, sensation intact, alert and  oriented x3 Psychiatric: Normal affect   Labs on Admission: I have personally reviewed following labs and imaging studies  CBC: Recent Labs  Lab 02/18/23 1600  WBC 21.5*  NEUTROABS 17.9*  HGB 14.4  HCT 44.3  MCV 83.3  PLT 0000000   Basic Metabolic Panel: Recent Labs  Lab 02/18/23 1600  NA 133*  K 3.2*  CL 102  CO2 22  GLUCOSE 101*  BUN 6  CREATININE 0.66  CALCIUM 8.2*   GFR: Estimated Creatinine Clearance: 77.9 mL/min (by C-G formula based on SCr of 0.66 mg/dL). Liver Function Tests: Recent Labs  Lab 02/18/23 1600  AST 17  ALT 11  ALKPHOS 87  BILITOT 1.2  PROT 7.0  ALBUMIN 3.5   No results for input(s): "LIPASE", "AMYLASE" in the last 168 hours. No results for input(s): "AMMONIA" in the last 168 hours. Coagulation Profile: No results for input(s): "INR", "PROTIME" in the last 168 hours. Cardiac Enzymes: No results for input(s): "CKTOTAL", "CKMB", "CKMBINDEX", "TROPONINI" in the last 168 hours. BNP (last 3 results) No results for input(s): "PROBNP" in the last 8760 hours. HbA1C: No results for input(s): "HGBA1C" in the last 72 hours. CBG: No results for input(s): "GLUCAP" in the last 168 hours. Lipid Profile: No results for input(s): "CHOL", "HDL", "LDLCALC", "TRIG", "CHOLHDL", "LDLDIRECT" in the last 72 hours. Thyroid Function Tests: No results for input(s): "TSH", "T4TOTAL", "FREET4", "T3FREE", "THYROIDAB" in the last 72 hours. Anemia Panel: No results for input(s): "VITAMINB12", "FOLATE", "FERRITIN", "TIBC", "IRON", "RETICCTPCT" in the last 72 hours. Urine analysis:    Component Value Date/Time   COLORURINE YELLOW (A) 02/18/2023 1709   APPEARANCEUR CLEAR (A) 02/18/2023 1709   APPEARANCEUR Clear 06/19/2013 1310   LABSPEC 1.011 02/18/2023 1709   LABSPEC 1.003 06/19/2013 1310   PHURINE 5.0 02/18/2023 1709   GLUCOSEU NEGATIVE 02/18/2023 1709   GLUCOSEU Negative 06/19/2013 1310   HGBUR SMALL (A) 02/18/2023 1709   BILIRUBINUR NEGATIVE 02/18/2023 1709    BILIRUBINUR Negative 06/19/2013 1310   KETONESUR 20 (A) 02/18/2023 1709   PROTEINUR NEGATIVE 02/18/2023 1709   NITRITE NEGATIVE 02/18/2023 1709   LEUKOCYTESUR NEGATIVE 02/18/2023 1709   LEUKOCYTESUR Negative 06/19/2013 1310    Radiological Exams on Admission: No results found.  EKG: Independently reviewed.  Sinus tachycardia  Assessment/Plan Principal Problem:   Mastitis Active Problems:   Asthma, chronic   Tobacco use  Acute mastitis Sepsis secondary to above No clear etiology.  Patient denies history of trauma to the breast.  Breast ultrasound pending official read however discussed between radiology and EDP.  Presentation consistent with acute mastitis.  No clear fluid collection or abscess to be drained.  Sepsis criteria met with tachycardia and leukocytosis Plan: Place in observation Aggressive IVF Rocephin 2 g daily Check blood cultures Multimodal pain control Monitor vitals and fever curve A.m. labs  Intractable nausea and vomiting Likely related to infectious process As needed Zofran IV fluids as above  Asthma, unknown severity Patient on room air with no wheezing No evidence of exacerbation Plan: As needed bronchodilators  Hypokalemia Mild.  Likely related underlying nausea and vomiting Potassium 3.2, will place potassium supplement in maintenance fluids    DVT prophylaxis: SQ Lovenox Code Status: Full Family Communication: None Disposition Plan: Return to previous home environment Consults called: None Admission status: Observation, MedSurg   Sidney Ace MD [facility]   If 7PM-7AM, please contact night-coverage    02/18/2023, 5:49 PM

## 2023-02-18 NOTE — Sepsis Progress Note (Signed)
Ceftriaxone administered prior to code sepsis activation.

## 2023-02-18 NOTE — ED Triage Notes (Addendum)
Patient presents with complaints of an abscess located next to her LEFT breast; States it's been there for a few days; Has not been opened/drained; Area is painful to touch with erythema

## 2023-02-18 NOTE — Progress Notes (Signed)
CODE SEPSIS - PHARMACY COMMUNICATION  **Broad Spectrum Antibiotics should be administered within 1 hour of Sepsis diagnosis**  Time Code Sepsis Called/Page Received: 1734  Antibiotics Ordered: Ceftriaxone administered prior to code sepsis activation  Time of 1st antibiotic administration: Ceftriaxone given at 1556  Additional action taken by pharmacy: Discussed with admitting provider. Hold off on broadening / further antibiotic orders at this time. Monitor closely. Has already received 30 cc/kg fluids. Continues on maintenance IVF.  Benita Gutter 02/18/2023  5:56 PM

## 2023-02-18 NOTE — Progress Notes (Signed)
Elink following code sepsis °

## 2023-02-18 NOTE — ED Provider Notes (Signed)
[facility] Provider Note    Event Date/Time   First MD Initiated Contact with Patient 02/18/23 1514     (approximate)   History   Abscess (Patient presents with complaints of an abscess located next to her LEFT breast; States it's been there for a few days; Has not been opened/drained; Area is painful to touch with erythema)   HPI  Jodi Tran is a 42 y.o. female with history of asthma presents emergency department with a red tender swollen area to the left breast.  Patient states been there for approximately 3 days.  Is very painful.  Tried to put a warm compress on it to help open up.  Has been unsuccessful.  Has been having chills and feeling hot.  Patient states she does not have a thermometer.  No chest pain or shortness of breath.      Physical Exam   Triage Vital Signs: ED Triage Vitals  Enc Vitals Group     BP 02/18/23 1453 (!) 128/94     Pulse Rate 02/18/23 1453 (!) 119     Resp 02/18/23 1453 19     Temp 02/18/23 1453 99.9 F (37.7 C)     Temp Source 02/18/23 1453 Oral     SpO2 --      Weight 02/18/23 1456 151 lb (68.5 kg)     Height 02/18/23 1456 4\' 11"  (1.499 m)     Head Circumference --      Peak Flow --      Pain Score 02/18/23 1456 10     Pain Loc --      Pain Edu? --      Excl. in [place]? --     Most recent vital signs: Vitals:   02/18/23 1453  BP: (!) 128/94  Pulse: (!) 119  Resp: 19  Temp: 99.9 F (37.7 C)     General: Awake, no distress.   CV:  Good peripheral perfusion. regular rate and  rhythm Resp:  Normal effort. Lungs cta Abd:  No distention.   Other:  Left breast with a 7 inch x 4 inch reddened area with hard tender center   ED Results / Procedures / Treatments   Labs (all labs ordered are listed, but only abnormal results are displayed) Labs Reviewed  COMPREHENSIVE METABOLIC PANEL - Abnormal; Notable for the following components:      Result Value   Sodium 133 (*)    Potassium 3.2 (*)    Glucose,  Bld 101 (*)    Calcium 8.2 (*)    All other components within normal limits  CBC WITH DIFFERENTIAL/PLATELET - Abnormal; Notable for the following components:   WBC 21.5 (*)    RBC 5.32 (*)    Neutro Abs 17.9 (*)    Monocytes Absolute 1.8 (*)    Abs Immature Granulocytes 0.13 (*)    All other components within normal limits  CULTURE, BLOOD (SINGLE)  LACTIC ACID, PLASMA  LACTIC ACID, PLASMA  URINALYSIS, ROUTINE W REFLEX MICROSCOPIC  URINE DRUG SCREEN, QUALITATIVE ([facility] ONLY)  PREGNANCY, URINE  POC URINE PREG, ED     EKG     RADIOLOGY Ultrasound soft tissue for abscess    PROCEDURES:  CRITICAL CARE:   yes   .Critical Care E&M  Performed by: Versie Starks, PA-C Critical care provider statement:    Critical care time (minutes):  45   Critical care time was exclusive of:  Separately billable procedures and treating other patients  Critical care was necessary to treat or prevent imminent or life-threatening deterioration of the following conditions:  Sepsis   Critical care was time spent personally by me on the following activities:  Blood draw for specimens, development of treatment plan with patient or surrogate, evaluation of patient's response to treatment, examination of patient, obtaining history from patient or surrogate, ordering and performing treatments and interventions, ordering and review of laboratory studies, ordering and review of radiographic studies, re-evaluation of patient's condition and review of old charts After initial E/M assessment, critical care services were subsequently performed that were exclusive of separately billable procedures or treatment.      MEDICATIONS ORDERED IN ED: Medications  famotidine (PEPCID) IVPB 20 mg premix (20 mg Intravenous New Bag/Given 02/18/23 1720)  sodium chloride 0.9 % bolus 1,000 mL (has no administration in time range)  sodium chloride 0.9 % bolus 1,000 mL (0 mLs Intravenous Stopped 02/18/23 1724)  cefTRIAXone  (ROCEPHIN) 2 g in sodium chloride 0.9 % 100 mL IVPB (0 g Intravenous Stopped 02/18/23 1724)  morphine (PF) 4 MG/ML injection 4 mg (4 mg Intravenous Given 02/18/23 1555)  ondansetron (ZOFRAN) injection 4 mg (4 mg Intravenous Given 02/18/23 1555)  metoCLOPramide (REGLAN) injection 10 mg (10 mg Intravenous Given 02/18/23 1720)     IMPRESSION / MDM / ASSESSMENT AND PLAN / ED COURSE  I reviewed the triage vital signs and the nursing notes.                              Differential diagnosis includes, but is not limited to, abscess, cellulitis, sepsis  Patient's presentation is most consistent with acute presentation with potential threat to life or bodily function.   The patient is tachycardic, has low-grade temp and has felt like she has fevers and I do have concerns that she is becoming septic.  She does not appear to be in the best health.  Labs and ultrasound of the left breast for abscess ordered I did go ahead and initiate fluids and antibiotics due to possible sepsis  I did review the patients past medical charts and do not see any evidence of previous sepsis or any iv drug use  EKG shows sinus rhythm with pacs, see physician read, I interpret this as being unrelated to her symptoms at this time   Patient's initial lactic acid is normal but patient is feeling worse, feel that she meets sepsis criteria and will need admission and iv antibiotics  Wbc elevated at [id number], metabolic panel is reassuring, ua uds pending  US of the left breast was independently reviewed and interpreted by me as being positive for mastitis, radiologist does not think its needs I and D at this time, recommends antibiotics  Consult to hospitalist for admission, aware of sepsis criteria being met and antibiotics given  Patient was also given more nausea medication, reglan and pepcid   Patient is agreeable to admission and appears hemodynamically stable at this time  FINAL CLINICAL IMPRESSION(S) / ED DIAGNOSES    Final diagnoses:  Cellulitis, unspecified cellulitis site  Acute sepsis ([place])  Mastitis     Rx / DC Orders   ED Discharge Orders     None        Note:  This document was prepared using Dragon voice recognition software and may include unintentional dictation errors.    Versie Starks, PA-C 02/18/23 1738    Arta Silence, MD 02/18/23 [phone]

## 2023-02-18 NOTE — ED Provider Notes (Signed)
ED ECG REPORT I, Arta Silence, the attending physician, personally viewed and interpreted this ECG.  Date: 02/18/2023 EKG Time: 1654 Rate: 86 Rhythm: normal sinus rhythm QRS Axis: normal Intervals: normal ST/T Wave abnormalities: normal Narrative Interpretation: no evidence of acute ischemia    Arta Silence, MD 02/18/23 1656

## 2023-02-19 ENCOUNTER — Observation Stay: Payer: 59

## 2023-02-19 DIAGNOSIS — Z79899 Other long term (current) drug therapy: Secondary | ICD-10-CM | POA: Diagnosis not present

## 2023-02-19 DIAGNOSIS — A419 Sepsis, unspecified organism: Secondary | ICD-10-CM | POA: Diagnosis present

## 2023-02-19 DIAGNOSIS — R112 Nausea with vomiting, unspecified: Secondary | ICD-10-CM | POA: Diagnosis present

## 2023-02-19 DIAGNOSIS — R5381 Other malaise: Secondary | ICD-10-CM | POA: Diagnosis present

## 2023-02-19 DIAGNOSIS — B9562 Methicillin resistant Staphylococcus aureus infection as the cause of diseases classified elsewhere: Secondary | ICD-10-CM | POA: Diagnosis present

## 2023-02-19 DIAGNOSIS — E876 Hypokalemia: Secondary | ICD-10-CM | POA: Diagnosis present

## 2023-02-19 DIAGNOSIS — Z882 Allergy status to sulfonamides status: Secondary | ICD-10-CM | POA: Diagnosis not present

## 2023-02-19 DIAGNOSIS — J45909 Unspecified asthma, uncomplicated: Secondary | ICD-10-CM | POA: Diagnosis present

## 2023-02-19 DIAGNOSIS — Z9049 Acquired absence of other specified parts of digestive tract: Secondary | ICD-10-CM | POA: Diagnosis not present

## 2023-02-19 DIAGNOSIS — Z87891 Personal history of nicotine dependence: Secondary | ICD-10-CM | POA: Diagnosis not present

## 2023-02-19 DIAGNOSIS — F1990 Other psychoactive substance use, unspecified, uncomplicated: Secondary | ICD-10-CM | POA: Diagnosis present

## 2023-02-19 DIAGNOSIS — N611 Abscess of the breast and nipple: Secondary | ICD-10-CM | POA: Diagnosis present

## 2023-02-19 DIAGNOSIS — N61 Mastitis without abscess: Secondary | ICD-10-CM | POA: Diagnosis present

## 2023-02-19 LAB — COMPREHENSIVE METABOLIC PANEL
ALT: 52 U/L — ABNORMAL HIGH (ref 0–44)
AST: 59 U/L — ABNORMAL HIGH (ref 15–41)
Albumin: 2.8 g/dL — ABNORMAL LOW (ref 3.5–5.0)
Alkaline Phosphatase: 127 U/L — ABNORMAL HIGH (ref 38–126)
Anion gap: 5 (ref 5–15)
BUN: <5 mg/dL — ABNORMAL LOW (ref 6–20)
CO2: 24 mmol/L (ref 22–32)
Calcium: 7.5 mg/dL — ABNORMAL LOW (ref 8.9–10.3)
Chloride: 108 mmol/L (ref 98–111)
Creatinine, Ser: 0.62 mg/dL (ref 0.44–1.00)
GFR, Estimated: >60 mL/min (ref >60)
Glucose, Bld: 107 mg/dL — ABNORMAL HIGH (ref 70–99)
Potassium: 3.4 mmol/L — ABNORMAL LOW (ref 3.5–5.1)
Sodium: 137 mmol/L (ref 135–145)
Total Bilirubin: 0.8 mg/dL (ref 0.3–1.2)
Total Protein: 5.7 g/dL — ABNORMAL LOW (ref 6.5–8.1)

## 2023-02-19 LAB — CBC
HCT: 38.2 % (ref 36.0–46.0)
Hemoglobin: 12.5 g/dL (ref 12.0–15.0)
MCH: 27.2 pg (ref 26.0–34.0)
MCHC: 32.7 g/dL (ref 30.0–36.0)
MCV: 83.2 fL (ref 80.0–100.0)
Platelets: 252 10*3/uL (ref 150–400)
RBC: 4.59 MIL/uL (ref 3.87–5.11)
RDW: 14.4 % (ref 11.5–15.5)
WBC: 15.8 10*3/uL — ABNORMAL HIGH (ref 4.0–10.5)
nRBC: 0 % (ref 0.0–0.2)

## 2023-02-19 LAB — HIV ANTIBODY (ROUTINE TESTING W REFLEX): HIV Screen 4th Generation wRfx: NONREACTIVE

## 2023-02-19 MED ORDER — NICOTINE 21 MG/24HR TD PT24
21.0000 mg | MEDICATED_PATCH | Freq: Every day | TRANSDERMAL | Status: DC
  Administered 2023-02-19 – 2023-02-23 (×5): 21 mg via TRANSDERMAL
  Filled 2023-02-19 (×5): qty 1

## 2023-02-19 MED ORDER — SODIUM CHLORIDE 0.9 % IV SOLN
2.0000 g | INTRAVENOUS | Status: DC
  Administered 2023-02-19 – 2023-02-22 (×4): 2 g via INTRAVENOUS
  Filled 2023-02-19 (×2): qty 2
  Filled 2023-02-19: qty 20
  Filled 2023-02-19: qty 2
  Filled 2023-02-19: qty 20

## 2023-02-19 MED ORDER — OXYCODONE HCL 5 MG PO TABS
5.0000 mg | ORAL_TABLET | ORAL | Status: DC | PRN
  Administered 2023-02-19: 5 mg via ORAL
  Administered 2023-02-19 – 2023-02-21 (×7): 10 mg via ORAL
  Administered 2023-02-22: 5 mg via ORAL
  Administered 2023-02-22 – 2023-02-23 (×3): 10 mg via ORAL
  Filled 2023-02-19 (×10): qty 2
  Filled 2023-02-19: qty 1
  Filled 2023-02-19: qty 2

## 2023-02-19 NOTE — Progress Notes (Signed)
PROGRESS NOTE    Jodi Tran  [id number] DOB: [birth year] DOA: 02/18/2023 PCP: Pcp, No    Brief Narrative:   42 y.o. female with medical history significant of asthma unknown severity, tobacco use who presents the ED with a 2 to 3-day history of left breast pain associated with weakness, malaise, poor p.o. intake.  Patient states no history of trauma to the breast.  On presentation the patient has signs and symptoms consistent with sepsis.  She has tachycardia, leukocytosis to 21.5.  Breast ultrasound performed in ED.  Results discussed between radiologist and EDP.  No clear breast abscess however ultrasonographic findings most consistent with acute mastitis.   Radiology confirmed acute mastitis.  No evidence of abscess or fluid collection.  Patient was started on intravenous Rocephin.  White blood cell count is decreasing.   Assessment & Plan:   Principal Problem:   Mastitis Active Problems:   Asthma, chronic   Tobacco use  Acute mastitis Sepsis secondary to above No clear etiology.  Patient denies history of trauma to the breast.  Breast ultrasound pending official read however discussed between radiology and EDP.  Presentation consistent with acute mastitis.  No clear fluid collection or abscess to be drained.  Sepsis criteria met with tachycardia and leukocytosis.  White count downtrending. Plan: Continue Rocephin 2 g daily.  Sepsis protocol was initiated after administration of IV antibiotics.  1 set of blood cultures were ordered by EDP.  These were not drawn until after antibiotics were administered.  Continue with multimodal pain control, aggressive IVF, daily labs  Intractable nausea and vomiting Likely related to infectious process Improved As needed Zofran IV fluids as above   Asthma, unknown severity Patient on room air with no wheezing No evidence of exacerbation Plan: As needed bronchodilators   Hypokalemia Mild.  Likely related underlying nausea and  vomiting Potassium and maintenance fluids  Polysubstance use UDS positive for cocaine, cannabinoids Counsel patient    DVT prophylaxis: SQ Lovenox Code Status: Full Family Communication: None today Disposition Plan: Status is: Observation The patient will require care spanning > 2 midnights and should be moved to inpatient because: Sepsis secondary to mastitis on IV antibiotics   Level of care: Med-Surg  Consultants:  None  Procedures:  None  Antimicrobials: Ceftriaxone   Subjective: Seen and examined.  Resting in bed.  Visibly less discomfort than prior.  Objective: Vitals:   02/18/23 1839 02/18/23 2107 02/19/23 0506 02/19/23 0915  BP: 95/67 (!) 85/61 91/60 98/68   Pulse: 91 79 99 80  Resp: 17 20 20 18   Temp: 98.7 F (37.1 C) 98.2 F (36.8 C) 98.7 F (37.1 C) 98.6 F (37 C)  TempSrc:  Oral    SpO2: 99% 99% 98% 100%  Weight:      Height:        Intake/Output Summary (Last 24 hours) at 02/19/2023 1143 Last data filed at 02/19/2023 1000 Gross per 24 hour  Intake 1049.06 ml  Output --  Net 1049.06 ml   Filed Weights   02/18/23 1456  Weight: 68.5 kg    Examination:  General exam: NAD Respiratory system: Clear to auscultation. Respiratory effort normal. Cardiovascular system: S1-S2 RRR, no murmurs, no pedal edema Gastrointestinal system: Soft, NT/ND, normal bowel sounds Central nervous system: Alert and oriented. No focal neurological deficits. Extremities: Symmetric 5 x 5 power. Skin: Hard, tender to palpation indurated lesion on lateral aspect of left breast Psychiatry: Judgement and insight appear normal. Mood & affect appropriate.     Data  Reviewed: I have personally reviewed following labs and imaging studies  CBC: Recent Labs  Lab 02/18/23 1600 02/19/23 0517  WBC 21.5* 15.8*  NEUTROABS 17.9*  --   HGB 14.4 12.5  HCT 44.3 38.2  MCV 83.3 83.2  PLT 349 [id number]   Basic Metabolic Panel: Recent Labs  Lab 02/18/23 1600 02/19/23 0517  NA  133* 137  K 3.2* 3.4*  CL 102 108  CO2 22 24  GLUCOSE 101* 107*  BUN 6 <5*  CREATININE 0.66 0.62  CALCIUM 8.2* 7.5*   GFR: Estimated Creatinine Clearance: 77.9 mL/min (by C-G formula based on SCr of 0.62 mg/dL). Liver Function Tests: Recent Labs  Lab 02/18/23 1600 02/19/23 0517  AST 17 59*  ALT 11 52*  ALKPHOS 87 127*  BILITOT 1.2 0.8  PROT 7.0 5.7*  ALBUMIN 3.5 2.8*   No results for input(s): "LIPASE", "AMYLASE" in the last 168 hours. No results for input(s): "AMMONIA" in the last 168 hours. Coagulation Profile: No results for input(s): "INR", "PROTIME" in the last 168 hours. Cardiac Enzymes: No results for input(s): "CKTOTAL", "CKMB", "CKMBINDEX", "TROPONINI" in the last 168 hours. BNP (last 3 results) No results for input(s): "PROBNP" in the last 8760 hours. HbA1C: No results for input(s): "HGBA1C" in the last 72 hours. CBG: No results for input(s): "GLUCAP" in the last 168 hours. Lipid Profile: No results for input(s): "CHOL", "HDL", "LDLCALC", "TRIG", "CHOLHDL", "LDLDIRECT" in the last 72 hours. Thyroid Function Tests: No results for input(s): "TSH", "T4TOTAL", "FREET4", "T3FREE", "THYROIDAB" in the last 72 hours. Anemia Panel: No results for input(s): "VITAMINB12", "FOLATE", "FERRITIN", "TIBC", "IRON", "RETICCTPCT" in the last 72 hours. Sepsis Labs: Recent Labs  Lab 02/18/23 1600 02/18/23 1857  LATICACIDVEN 0.9 0.7    Recent Results (from the past 240 hour(s))  Culture, blood (Routine X 2) w Reflex to ID Panel     Status: None (Preliminary result)   Collection Time: 02/18/23  6:57 PM   Specimen: BLOOD  Result Value Ref Range Status   Specimen Description BLOOD LEFT ANTECUBITAL  Final   Special Requests   Final    BOTTLES DRAWN AEROBIC AND ANAEROBIC Blood Culture adequate volume   Culture   Final    NO GROWTH < 12 HOURS Performed at [facility], [address] [place], [place] [address]    Report Status PENDING  Incomplete  Culture, blood  (Routine X 2) w Reflex to ID Panel     Status: None (Preliminary result)   Collection Time: 02/18/23  6:58 PM   Specimen: BLOOD  Result Value Ref Range Status   Specimen Description BLOOD BLOOD RIGHT HAND  Final   Special Requests   Final    BOTTLES DRAWN AEROBIC AND ANAEROBIC Blood Culture adequate volume   Culture   Final    NO GROWTH < 12 HOURS Performed at [facility], [address], [place], [place] [address]    Report Status PENDING  Incomplete         Radiology Studies: [place] LIMITED ULTRASOUND INCLUDING AXILLA LEFT BREAST   Result Date: 02/18/2023 CLINICAL DATA:  Left breast pain. Concern for abscess. Heart mass reported on exam. EXAM: ULTRASOUND OF THE LEFT BREAST COMPARISON:  None available. FINDINGS: Targeted ultrasound is performed, showing heterogeneous tissue in the left breast laterally at 3 o'clock. There are areas of relative increased and decreased echogenicity as well as small hypo to anechoic areas within this consistent with fluid. There is no defined mass. No discrete fluid collection to  suggest an abscess. IMPRESSION: 1. Ultrasound findings in the lateral left breast are consistent with mastitis. No evidence of an abscess. RECOMMENDATION: 1. Recommend appropriate antibiotic therapy with follow-up diagnostic mammography and ultrasound in 7-10 days to reassess for improvement/resolution. Earlier follow-up would be indicated if symptoms worsen. I have discussed the findings and recommendations with the patient. If applicable, a reminder letter will be sent to the patient regarding the next appointment. BI-RADS CATEGORY  2: Benign. Electronically Signed   By: Lajean Manes M.D.   On: 02/18/2023 16:53        Scheduled Meds:  enoxaparin (LOVENOX) injection  40 mg Subcutaneous Q24H   ketorolac  15 mg Intravenous Q6H   multivitamin with minerals  1 tablet Oral Daily   Continuous Infusions:  lactated ringers 1,000 mL with potassium chloride 20 mEq infusion 100  mL/hr at 02/19/23 1000     LOS: 0 days      Sidney Ace, MD [facility]   If 7PM-7AM, please contact night-coverage  02/19/2023, 11:43 AM

## 2023-02-20 ENCOUNTER — Inpatient Hospital Stay: Payer: 59

## 2023-02-20 LAB — CBC WITH DIFFERENTIAL/PLATELET
Abs Immature Granulocytes: 0.06 10*3/uL (ref 0.00–0.07)
Basophils Absolute: 0.1 10*3/uL (ref 0.0–0.1)
Basophils Relative: 1 %
Eosinophils Absolute: 0.2 10*3/uL (ref 0.0–0.5)
Eosinophils Relative: 2 %
HCT: 39 % (ref 36.0–46.0)
Hemoglobin: 12.4 g/dL (ref 12.0–15.0)
Immature Granulocytes: 1 %
Lymphocytes Relative: 9 %
Lymphs Abs: 1 10*3/uL (ref 0.7–4.0)
MCH: 27.2 pg (ref 26.0–34.0)
MCHC: 31.8 g/dL (ref 30.0–36.0)
MCV: 85.5 fL (ref 80.0–100.0)
Monocytes Absolute: 0.7 10*3/uL (ref 0.1–1.0)
Monocytes Relative: 6 %
Neutro Abs: 9.5 10*3/uL — ABNORMAL HIGH (ref 1.7–7.7)
Neutrophils Relative %: 81 %
Platelets: 277 10*3/uL (ref 150–400)
RBC: 4.56 MIL/uL (ref 3.87–5.11)
RDW: 14.4 % (ref 11.5–15.5)
WBC: 11.6 10*3/uL — ABNORMAL HIGH (ref 4.0–10.5)
nRBC: 0 % (ref 0.0–0.2)

## 2023-02-20 LAB — BASIC METABOLIC PANEL
Anion gap: 8 (ref 5–15)
BUN: <5 mg/dL — ABNORMAL LOW (ref 6–20)
CO2: 25 mmol/L (ref 22–32)
Calcium: 8.3 mg/dL — ABNORMAL LOW (ref 8.9–10.3)
Chloride: 105 mmol/L (ref 98–111)
Creatinine, Ser: 0.57 mg/dL (ref 0.44–1.00)
GFR, Estimated: >60 mL/min (ref >60)
Glucose, Bld: 110 mg/dL — ABNORMAL HIGH (ref 70–99)
Potassium: 3.4 mmol/L — ABNORMAL LOW (ref 3.5–5.1)
Sodium: 138 mmol/L (ref 135–145)

## 2023-02-20 MED ORDER — LIDOCAINE HCL (PF) 1 % IJ SOLN
5.0000 mL | Freq: Once | INTRAMUSCULAR | Status: AC
  Administered 2023-02-20: 5 mL via INTRADERMAL

## 2023-02-20 MED ORDER — POTASSIUM CHLORIDE CRYS ER 20 MEQ PO TBCR
40.0000 meq | EXTENDED_RELEASE_TABLET | Freq: Once | ORAL | Status: AC
  Administered 2023-02-20: 40 meq via ORAL
  Filled 2023-02-20: qty 2

## 2023-02-20 NOTE — Progress Notes (Signed)
Patient noted to be room crying by dietary aide. Entered patient's room. Patient tearful, with calm demeanor. Patient states she "just misses her kids and grandson." Jodi Tran is 42 years old, and she is very involved in his life, frequently caring for him. She says she has been able to speak with family on the phone, but "it isn't the same."   Patient denies any SI or HI.   Husband will be to visit shortly. Patient provided with emotional support. Plan to re-assess patient later today.

## 2023-02-20 NOTE — Progress Notes (Signed)
Progress Note   Patient: Jodi Tran [id number] DOB: 23-Feb-1981 DOA: 02/18/2023     1 DOS: the patient was seen and examined on 02/20/2023    Subjective: Patient seen and examined at bedside this morning Admits to some more firmness in the area of inflammation in the left breast Patient's nurse who had patient in previous days also admits to more firmness and worried about developing abscess Ultrasound of the breast have been ordered to rule out abscess Denies chest pain nausea vomiting abdominal pain or urinary complaints   Brief hospital course:  42 y.o. female with medical history significant of asthma unknown severity, tobacco use who presents the ED with a 2 to 3-day history of left breast pain associated with weakness, malaise, poor p.o. intake.  Patient states no history of trauma to the breast.  On presentation the patient has signs and symptoms consistent with sepsis.    Radiology confirmed acute mastitis.  No evidence of abscess or fluid collection.  Patient was started on intravenous Rocephin.  White blood cell count is decreasing.    Physical Exam:  General exam: NAD Respiratory system: Clear to auscultation. Respiratory effort normal. Cardiovascular system: S1-S2 RRR, no murmurs, no pedal edema Gastrointestinal system: Soft, NT/ND, normal bowel sounds Central nervous system: Alert and oriented. No focal neurological deficits. Extremities: Symmetric 5 x 5 power. Skin: Hard, tender to palpation indurated lesion on lateral aspect of left breast Psychiatry: Judgement and insight appear normal. Mood & affect appropriate.    Assessment and Plan: Acute mastitis Sepsis secondary to above No clear etiology.  Patient denies history of trauma to the breast.  Breast ultrasound pending official read however discussed between radiology and EDP.  Presentation consistent with acute mastitis.  No clear fluid collection or abscess to be drained.  Sepsis criteria met with tachycardia  and leukocytosis.  White count downtrending. Plan: Continue Rocephin 2 g daily.  Sepsis protocol was initiated after administration of IV antibiotics.  1 set of blood cultures were ordered by EDP.  These were not drawn until after antibiotics were administered.  Continue with multimodal pain control, aggressive IVF, daily labs   Intractable nausea and vomiting Likely related to infectious process Improved As needed Zofran IV fluids as above   Asthma, unknown severity Patient on room air with no wheezing No evidence of exacerbation Plan: As needed bronchodilators   Hypokalemia Mild.  Likely related underlying nausea and vomiting Potassium and maintenance fluids   Polysubstance use UDS positive for cocaine, cannabinoids Counsel patient       DVT prophylaxis: SQ Lovenox Code Status: Full Family Communication: None today Disposition Plan: Status is: Observation The patient will require care spanning > 2 midnights and should be moved to inpatient because: Sepsis secondary to mastitis on IV antibiotics      Data Reviewed: I have reviewed the patient's laboratory results that shows potassium was 3.4 and WBC improved to 15.8 these were yesterday's results and have requested another one for today.  Have also reviewed today's results showing hypokalemia 3.4 which is being repleted as well as improvement in WBC from 15.8-11.6   Family Communication: No family present at bedside at the moment  Disposition: Status is: Inpatient Patient still continues to meet criteria for inpatient given IV antibiotic therapy and possible need for abscess drainage if confirmed on repeat ultrasound   Planned Discharge Destination: Home Time spent: 40 minutes   Vitals:   02/19/23 1714 02/19/23 2159 02/20/23 0641 02/20/23 0828  BP: 103/73 115/61 99/70 Marland Kitchen)  81/53  Pulse: 77 92 78 77  Resp: 18 20 20 17   Temp: 98.4 F (36.9 C) 98.7 F (37.1 C)  98.1 F (36.7 C)  TempSrc:      SpO2: 100% 100% 100%  100%  Weight:      Height:        Author: Verline Lema, MD 02/20/2023 1:10 PM  For on call review [email or web address].

## 2023-02-21 LAB — BASIC METABOLIC PANEL
Anion gap: 8 (ref 5–15)
BUN: <5 mg/dL — ABNORMAL LOW (ref 6–20)
CO2: 25 mmol/L (ref 22–32)
Calcium: 8.5 mg/dL — ABNORMAL LOW (ref 8.9–10.3)
Chloride: 107 mmol/L (ref 98–111)
Creatinine, Ser: 0.52 mg/dL (ref 0.44–1.00)
GFR, Estimated: >60 mL/min (ref >60)
Glucose, Bld: 100 mg/dL — ABNORMAL HIGH (ref 70–99)
Potassium: 3.6 mmol/L (ref 3.5–5.1)
Sodium: 140 mmol/L (ref 135–145)

## 2023-02-21 LAB — CBC WITH DIFFERENTIAL/PLATELET
Abs Immature Granulocytes: 0.06 10*3/uL (ref 0.00–0.07)
Basophils Absolute: 0.1 10*3/uL (ref 0.0–0.1)
Basophils Relative: 1 %
Eosinophils Absolute: 0.3 10*3/uL (ref 0.0–0.5)
Eosinophils Relative: 3 %
HCT: 34.8 % — ABNORMAL LOW (ref 36.0–46.0)
Hemoglobin: 11.3 g/dL — ABNORMAL LOW (ref 12.0–15.0)
Immature Granulocytes: 1 %
Lymphocytes Relative: 16 %
Lymphs Abs: 1.7 10*3/uL (ref 0.7–4.0)
MCH: 27.5 pg (ref 26.0–34.0)
MCHC: 32.5 g/dL (ref 30.0–36.0)
MCV: 84.7 fL (ref 80.0–100.0)
Monocytes Absolute: 0.9 10*3/uL (ref 0.1–1.0)
Monocytes Relative: 8 %
Neutro Abs: 7.5 10*3/uL (ref 1.7–7.7)
Neutrophils Relative %: 71 %
Platelets: 297 10*3/uL (ref 150–400)
RBC: 4.11 MIL/uL (ref 3.87–5.11)
RDW: 14.3 % (ref 11.5–15.5)
WBC: 10.5 10*3/uL (ref 4.0–10.5)
nRBC: 0 % (ref 0.0–0.2)

## 2023-02-21 NOTE — Discharge Instructions (Signed)
Some PCP options in [place] area- not a comprehensive list  [facility]- [phone] - [phone] [facility]- [phone] [facility]- [phone] [facility]- [phone] [facility]- [phone]  or [place] [facility] Referral Line [phone]  

## 2023-02-21 NOTE — Progress Notes (Signed)
  Progress Note   Patient: Jodi Tran [id number] DOB: 07-13-1981 DOA: 02/18/2023     2 DOS: the patient was seen and examined on 02/21/2023     Subjective: Patient seen and examined at bedside this morning She admits to improvement in the redness around the affected breast  ultrasound showed fluid collection and patient is s/p incision and drainage     Brief hospital course:  42 y.o. female with medical history significant of asthma unknown severity, tobacco use who presents the ED with a 2 to 3-day history of left breast pain associated with weakness, malaise, poor p.o. intake.  Patient states no history of trauma to the breast.  On presentation the patient has signs and symptoms consistent with sepsis.    Radiology confirmed acute mastitis.  Patient has undergone incision and drainage of fluid collection in the left breast area. patient was started on intravenous Rocephin.  White blood cell count is decreasing.    Physical Exam:   General exam: NAD Respiratory system: Clear to auscultation. Respiratory effort normal. Cardiovascular system: S1-S2 RRR, no murmurs, no pedal edema Gastrointestinal system: Soft, NT/ND, normal bowel sounds Central nervous system: Alert and oriented. No focal neurological deficits. Extremities: Symmetric 5 x 5 power. Skin: Hard, tender to palpation indurated lesion on lateral aspect of left breast Psychiatry: Judgement and insight appear normal. Mood & affect appropriate.      Assessment and Plan: Acute mastitis Sepsis secondary to above No clear etiology.  Patient denies history of trauma to the breast.  Breast ultrasound pending official read however discussed between radiology and EDP.  Presentation consistent with acute mastitis.  Initial ultrasound did not show any fluid or abscess collection however repeat ultrasound 2 days after admission showed some evidence of abscess. sepsis criteria met with tachycardia and leukocytosis.  White count  downtrending. Plan: Continue all current antibiotics Patient underwent incision and drainage by interventional radiologist on 02/20/2023  Intractable nausea and vomiting-resolved As needed Zofran IV fluid discontinued   Asthma, unknown severity Patient on room air with no wheezing No evidence of exacerbation Plan: As needed bronchodilators   Hypokalemia Mild.  Likely related underlying nausea and vomiting Potassium and maintenance fluids   Polysubstance use UDS positive for cocaine, cannabinoids Counsel patient       DVT prophylaxis: SQ Lovenox Code Status: Full Family Communication: None today Disposition Plan: Status is: Inpatient Meets inpatient criteria due to requiring IV antibiotics and having undergone incision and drainage for breast abscess       Data Reviewed: I have reviewed the patient's laboratory results that shows WBC improvement from 21,000 on admission to 10,000 today Breath abscess sample showing GPC   Family Communication: No family present at bedside at the moment      Planned Discharge Destination: Home  Time spent: 38 minutes    Vitals:   02/20/23 0828 02/20/23 2037 02/21/23 0324 02/21/23 0830  BP: (!) 81/53 103/66 (!) 87/51 (!) 91/50  Pulse: 77 84 80 62  Resp: 17 18 18 20   Temp: 98.1 F (36.7 C) 98.1 F (36.7 C) 98.2 F (36.8 C) 98.9 F (37.2 C)  TempSrc:      SpO2: 100% 100% 100% 100%  Weight:      Height:        Author: Verline Lema, MD 02/21/2023 1:16 PM  For on call review [email or web address].

## 2023-02-22 LAB — CBC WITH DIFFERENTIAL/PLATELET
Abs Immature Granulocytes: 0.06 10*3/uL (ref 0.00–0.07)
Basophils Absolute: 0.1 10*3/uL (ref 0.0–0.1)
Basophils Relative: 1 %
Eosinophils Absolute: 0.5 10*3/uL (ref 0.0–0.5)
Eosinophils Relative: 6 %
HCT: 34.2 % — ABNORMAL LOW (ref 36.0–46.0)
Hemoglobin: 11.1 g/dL — ABNORMAL LOW (ref 12.0–15.0)
Immature Granulocytes: 1 %
Lymphocytes Relative: 20 %
Lymphs Abs: 1.7 10*3/uL (ref 0.7–4.0)
MCH: 27.2 pg (ref 26.0–34.0)
MCHC: 32.5 g/dL (ref 30.0–36.0)
MCV: 83.8 fL (ref 80.0–100.0)
Monocytes Absolute: 0.9 10*3/uL (ref 0.1–1.0)
Monocytes Relative: 10 %
Neutro Abs: 5.2 10*3/uL (ref 1.7–7.7)
Neutrophils Relative %: 62 %
Platelets: 297 10*3/uL (ref 150–400)
RBC: 4.08 MIL/uL (ref 3.87–5.11)
RDW: 14.2 % (ref 11.5–15.5)
WBC: 8.3 10*3/uL (ref 4.0–10.5)
nRBC: 0 % (ref 0.0–0.2)

## 2023-02-22 LAB — BODY FLUID CULTURE W GRAM STAIN

## 2023-02-22 LAB — BASIC METABOLIC PANEL
Anion gap: 6 (ref 5–15)
BUN: <5 mg/dL — ABNORMAL LOW (ref 6–20)
CO2: 26 mmol/L (ref 22–32)
Calcium: 8 mg/dL — ABNORMAL LOW (ref 8.9–10.3)
Chloride: 108 mmol/L (ref 98–111)
Creatinine, Ser: 0.61 mg/dL (ref 0.44–1.00)
GFR, Estimated: >60 mL/min (ref >60)
Glucose, Bld: 99 mg/dL (ref 70–99)
Potassium: 3.7 mmol/L (ref 3.5–5.1)
Sodium: 140 mmol/L (ref 135–145)

## 2023-02-22 MED ORDER — VANCOMYCIN HCL 1500 MG/300ML IV SOLN
1500.0000 mg | Freq: Once | INTRAVENOUS | Status: AC
  Administered 2023-02-22: 1500 mg via INTRAVENOUS
  Filled 2023-02-22: qty 300

## 2023-02-22 MED ORDER — SODIUM CHLORIDE 0.9 % IV SOLN: INTRAVENOUS | Status: DC | PRN

## 2023-02-22 MED ORDER — VANCOMYCIN HCL IN DEXTROSE 1-5 GM/200ML-% IV SOLN
1000.0000 mg | INTRAVENOUS | Status: DC
  Administered 2023-02-22: 1000 mg via INTRAVENOUS
  Filled 2023-02-22: qty 200

## 2023-02-22 NOTE — Progress Notes (Addendum)
  Progress Note   Patient: Jodi Tran [id number] DOB: 06-Jul-1981 DOA: 02/18/2023     3 DOS: the patient was seen and examined on 02/22/2023     Subjective: Patient seen and examined at bedside this morning She admits to improvement in the redness around the affected breast She denies nausea vomiting chest pain or cough     Brief hospital course:  42 y.o. female with medical history significant of asthma unknown severity, tobacco use who presents the ED with a 2 to 3-day history of left breast pain associated with weakness, malaise, poor p.o. intake.  Patient states no history of trauma to the breast.  On presentation the patient has signs and symptoms consistent with sepsis.    Radiology confirmed acute mastitis.  Patient has undergone incision and drainage of fluid collection in the left breast area. patient was started on intravenous Rocephin.  White blood cell count is decreasing.    Physical Exam: General exam: NAD Respiratory system: Clear to auscultation. Respiratory effort normal. Cardiovascular system: S1-S2 RRR, no murmurs, no pedal edema Gastrointestinal system: Soft, NT/ND, normal bowel sounds Central nervous system: Alert and oriented. No focal neurological deficits. Extremities: Symmetric 5 x 5 power. Skin: Hard, tender to palpation indurated lesion on lateral aspect of left breast Psychiatry: Judgement and insight appear normal. Mood & affect appropriate.      Assessment and Plan: Acute mastitis Sepsis secondary to above No clear etiology.  Patient denies history of trauma to the breast.   Presentation consistent with acute mastitis.   Initial ultrasound did not show any fluid or abscess collection however repeat ultrasound 2 days after admission showed some evidence of abscess. sepsis criteria met with tachycardia and leukocytosis.  White count downtrending. Plan: Continue all current antibiotics Patient underwent incision and drainage by interventional  radiologist on 02/20/2023 Abscess fluid showing staphylococcal sensitivity pending  vancomycin have been added and pharmacy consulted for dosing  Intractable nausea and vomiting-resolved As needed Zofran IV fluid discontinued   Asthma, unknown severity Patient on room air with no wheezing No evidence of exacerbation Plan: As needed bronchodilators   Hypokalemia Mild.  Likely related underlying nausea and vomiting Potassium and maintenance fluids   Polysubstance use UDS positive for cocaine, cannabinoids Counsel patient       DVT prophylaxis: SQ Lovenox Code Status: Full Family Communication: None today Disposition Plan: Status is: Inpatient Meets inpatient criteria due to requiring IV antibiotics and having undergone incision and drainage for breast abscess       Data Reviewed: I have reviewed the patient's laboratory results today   Family Communication: No family present at bedside at the moment      Planned Discharge Destination: Home   Time spent: 35 minutes      Vitals:   02/21/23 2035 02/22/23 0548 02/22/23 0700 02/22/23 1100  BP: 90/61 (!) 85/58 (!) 85/45 107/68  Pulse: 86 68 67   Resp: 16 16 16    Temp: 98.4 F (36.9 C) 98 F (36.7 C) 97.9 F (36.6 C)   TempSrc:  Oral Oral   SpO2: 100% 100% 99%   Weight:      Height:        Author: Verline Lema, MD 02/22/2023 12:19 PM  For on call review [email or web address].

## 2023-02-22 NOTE — Progress Notes (Signed)
Pharmacy Antibiotic Note  Jodi Tran is a 42 y.o. female admitted on 02/18/2023 with cellulitis.  Pharmacy has been consulted for vancomycin dosing.  Plan: start vancomycin 1500 mg IV x 1 then 1000 mg IV every 24 hours Goal AUC 400-550. Expected AUC: 514.2 SCr used: 0.80 mg/dL (rounded up) Ke 0.057 h-1, T1/2 12.2h  Height: 4\' 11"  (149.9 cm) Weight: 68.5 kg (151 lb) IBW/kg (Calculated) : 43.2  Temp (24hrs), Avg:98.1 F (36.7 C), Min:97.9 F (36.6 C), Max:98.4 F (36.9 C)  Recent Labs  Lab 02/18/23 1600 02/18/23 1857 02/19/23 0517 02/20/23 1100 02/21/23 0429 02/22/23 0500  WBC 21.5*  --  15.8* 11.6* 10.5 8.3  CREATININE 0.66  --  0.62 0.57 0.52 0.61  LATICACIDVEN 0.9 0.7  --   --   --   --     Estimated Creatinine Clearance: 77.9 mL/min (by C-G formula based on SCr of 0.61 mg/dL).    Allergies  Allergen Reactions   Bactrim [Sulfamethoxazole-Trimethoprim] Shortness Of Breath    Antimicrobials this admission: 03/27 ceftriaxone >>  03/31 vancomycin >>   Microbiology results: 03/27 BCx: NG x 4 days 03/29 WCx: S aureus (sensitivities pending)   Thank you for allowing pharmacy to be a part of this patient's care.  Dallie Piles 02/22/2023 8:33 AM

## 2023-02-23 ENCOUNTER — Other Ambulatory Visit: Payer: Self-pay

## 2023-02-23 LAB — BASIC METABOLIC PANEL
Anion gap: 6 (ref 5–15)
BUN: <5 mg/dL — ABNORMAL LOW (ref 6–20)
CO2: 26 mmol/L (ref 22–32)
Calcium: 8.3 mg/dL — ABNORMAL LOW (ref 8.9–10.3)
Chloride: 109 mmol/L (ref 98–111)
Creatinine, Ser: 0.63 mg/dL (ref 0.44–1.00)
GFR, Estimated: >60 mL/min (ref >60)
Glucose, Bld: 96 mg/dL (ref 70–99)
Potassium: 3.6 mmol/L (ref 3.5–5.1)
Sodium: 141 mmol/L (ref 135–145)

## 2023-02-23 LAB — CBC WITH DIFFERENTIAL/PLATELET
Abs Immature Granulocytes: 0.06 10*3/uL (ref 0.00–0.07)
Basophils Absolute: 0.1 10*3/uL (ref 0.0–0.1)
Basophils Relative: 1 %
Eosinophils Absolute: 0.6 10*3/uL — ABNORMAL HIGH (ref 0.0–0.5)
Eosinophils Relative: 8 %
HCT: 34.9 % — ABNORMAL LOW (ref 36.0–46.0)
Hemoglobin: 11.2 g/dL — ABNORMAL LOW (ref 12.0–15.0)
Immature Granulocytes: 1 %
Lymphocytes Relative: 27 %
Lymphs Abs: 1.9 10*3/uL (ref 0.7–4.0)
MCH: 27 pg (ref 26.0–34.0)
MCHC: 32.1 g/dL (ref 30.0–36.0)
MCV: 84.1 fL (ref 80.0–100.0)
Monocytes Absolute: 0.8 10*3/uL (ref 0.1–1.0)
Monocytes Relative: 10 %
Neutro Abs: 3.9 10*3/uL (ref 1.7–7.7)
Neutrophils Relative %: 53 %
Platelets: 311 10*3/uL (ref 150–400)
RBC: 4.15 MIL/uL (ref 3.87–5.11)
RDW: 14.2 % (ref 11.5–15.5)
WBC: 7.3 10*3/uL (ref 4.0–10.5)
nRBC: 0.4 % — ABNORMAL HIGH (ref 0.0–0.2)

## 2023-02-23 LAB — CULTURE, BLOOD (ROUTINE X 2)
Culture: NO GROWTH
Culture: NO GROWTH
Special Requests: ADEQUATE
Special Requests: ADEQUATE

## 2023-02-23 MED ORDER — DOXYCYCLINE HYCLATE 100 MG PO TABS
100.0000 mg | ORAL_TABLET | Freq: Two times a day (BID) | ORAL | Status: DC
  Administered 2023-02-23: 100 mg via ORAL
  Filled 2023-02-23: qty 1

## 2023-02-23 MED ORDER — IBUPROFEN 800 MG PO TABS: 800.0000 mg | ORAL_TABLET | Freq: Three times a day (TID) | ORAL | 0 refills | Status: AC | PRN

## 2023-02-23 MED ORDER — CHLORHEXIDINE GLUCONATE CLOTH 2 % EX PADS: 6.0000 | MEDICATED_PAD | Freq: Every day | CUTANEOUS | Status: DC

## 2023-02-23 MED ORDER — DOXYCYCLINE MONOHYDRATE 100 MG PO CAPS: 100.0000 mg | ORAL_CAPSULE | Freq: Two times a day (BID) | ORAL | 0 refills | Status: AC

## 2023-02-23 MED ORDER — LACTATED RINGERS IV SOLN: INTRAVENOUS | Status: DC

## 2023-02-23 MED ORDER — MUPIROCIN 2 % EX OINT
1.0000 | TOPICAL_OINTMENT | Freq: Two times a day (BID) | CUTANEOUS | Status: DC
  Filled 2023-02-23: qty 22

## 2023-02-23 NOTE — Progress Notes (Signed)
Patient is being discharged home. Discharge papers given and explained to patient. She verbalized understanding. Medications and f/u appointment reviewed with patient. She verbalized understanding. Rx sent electronically to the pharmacy. Patient made aware.

## 2023-02-23 NOTE — Discharge Summary (Signed)
  Physician Discharge Summary   Patient: Jodi Tran MRN: [id number] DOB: [birth year]  Admit date:     02/18/2023  Discharge date: 02/23/23  Discharge Physician: Verline Lema   PCP: Pcp, No     Discharge Diagnoses: Acute mastitis Sepsis secondary to above Intractable nausea and vomiting-resolved Asthma, unknown severity Hypokalemia-improved Polysubstance use  Hospital Course:  42 y.o. female with medical history significant of asthma unknown severity, tobacco use who presents the ED with a 2 to 3-day history of left breast pain associated with weakness, malaise, poor p.o. intake.  Patient states no history of trauma to the breast.  On presentation the patient has signs and symptoms consistent with sepsis.    Radiology confirmed acute mastitis.  Breast ultrasound done showed findings of fluid collection . patient has undergone incision and drainage of fluid collection in the left breast area.  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  Patient is therefore being discharged to complete 10 days course of doxycycline   Procedures performed: As above Disposition: Home Diet recommendation:  Discharge Diet Orders (From admission, onward)     Start     Ordered   02/23/23 0000  Diet - low sodium heart healthy        02/23/23 1422           Cardiac diet DISCHARGE MEDICATION: Allergies as of 02/23/2023       Reactions   Bactrim [sulfamethoxazole-trimethoprim] Shortness Of Breath        Medication List     STOP taking these medications    amoxicillin 500 MG capsule Commonly known as: AMOXIL   diclofenac sodium 1 % Gel Commonly known as: VOLTAREN       TAKE these medications    albuterol 108 (90 Base) MCG/ACT inhaler Commonly known as: VENTOLIN HFA Inhale 2 puffs into the lungs every 4 (four) hours as needed for wheezing.   carisoprodol 350 MG tablet Commonly known as: Soma Take 1 tablet (350 mg total) by mouth 3 (three) times daily as  needed.   chlorhexidine 0.12 % solution Commonly known as: PERIDEX Use as directed 15 mLs in the mouth or throat 2 (two) times daily.   doxycycline 100 MG capsule Commonly known as: MONODOX Take 1 capsule (100 mg total) by mouth 2 (two) times daily for 10 days.   ibuprofen 800 MG tablet Commonly known as: ADVIL Take 1 tablet (800 mg total) by mouth every 8 (eight) hours as needed for up to 5 days.   ondansetron 4 MG disintegrating tablet Commonly known as: Zofran ODT Take 1 tablet (4 mg total) by mouth every 8 (eight) hours as needed.        Discharge Exam: Filed Weights   02/18/23 1456  Weight: 68.5 kg   General exam: NAD Respiratory system: Clear to auscultation. Respiratory effort normal. Cardiovascular system: S1-S2 RRR, no murmurs, no pedal edema Gastrointestinal system: Soft, NT/ND, normal bowel sounds Central nervous system: Alert and oriented. No focal neurological deficits. Extremities: Symmetric 5 x 5 power. Skin: Erythema around the left breast area significantly improved Psychiatry: Judgement and insight appear normal. Mood & affect appropriate.   Condition at discharge: good  Discharge time spent: greater than 30 minutes.  Signed: Verline Lema, MD [facility] 02/23/2023

## 2023-02-23 NOTE — TOC Transition Note (Signed)
Transition of Care [facility]) - CM/SW Discharge Note   Patient Details  Name: Jodi Tran MRN: [id number] Date of Birth: 1981-05-02  Transition of Care [facility] CM/SW Contact:  Gerilyn Pilgrim, LCSW Phone Number: 02/23/2023, 1:50 PM   Clinical Narrative:   Pt discharging today. Pharmacy team confirmed active pharmacy insurance. PCP list added to patients AVS. No additional needs at this time. CSW signing off.           Patient Goals and CMS Choice      Discharge Placement                         Discharge Plan and Services Additional resources added to the After Visit Summary for                                       Social Determinants of Health (SDOH) Interventions SDOH Screenings   Food Insecurity: No Food Insecurity (02/18/2023)  Housing: Low Risk  (02/18/2023)  Transportation Needs: No Transportation Needs (02/18/2023)  Utilities: Not At Risk (02/18/2023)  Tobacco Use: High Risk (02/18/2023)     Readmission Risk Interventions     No data to display

## 2023-02-23 NOTE — Progress Notes (Signed)
       CROSS COVER NOTE  NAME: Jodi Tran MRN: [id number] DOB : 10/20/1981 ATTENDING PHYSICIAN: Verline Lema, MD    Date of Service   02/23/2023   HPI/Events of Note   Report/Request  "The patients arm after time starts to swell due to her getting LR with potassium. It seems like she is having a sensitivity to it. Her potassium is at 3.7 right now. If it is possible, if she needs to still get potassium, could she start it PO now?"    Interventions   Assessment/Plan: LR at 100 mL/hr  LR with K discontinued       To reach the provider On-Call:   7AM- 7PM see care teams to locate the attending and reach out to them via [email or web address] Password: [email or web address] 7PM-7AM contact night-coverage If you still have difficulty reaching the appropriate provider, please page the [facility] (Director on Call) for [facility] on amion for assistance  This document was prepared using Systems analyst and may include unintentional dictation errors.  Neomia Glass DNP, MBA, FNP-BC, PMHNP-BC Nurse Practitioner [facility] Pager [phone]

## 2023-02-23 NOTE — Progress Notes (Signed)
MRSA PCR was ordered but not collected. Patient is being discharged. This Probation officer asked Dr. Rande Brunt if need to collect prior to discharge. MD requested to ask ID nurse. Per ID nurse no need to collect.

## 2024-04-10 ENCOUNTER — Other Ambulatory Visit: Payer: Self-pay

## 2024-04-10 ENCOUNTER — Emergency Department
Admission: EM | Admit: 2024-04-10 | Discharge: 2024-04-10 | Disposition: A | Discharge status: Home / Self Care | Attending: Emergency Medicine | Admitting: Emergency Medicine

## 2024-04-10 DIAGNOSIS — E876 Hypokalemia: Secondary | ICD-10-CM | POA: Insufficient documentation

## 2024-04-10 DIAGNOSIS — R112 Nausea with vomiting, unspecified: Secondary | ICD-10-CM | POA: Insufficient documentation

## 2024-04-10 DIAGNOSIS — R197 Diarrhea, unspecified: Secondary | ICD-10-CM | POA: Diagnosis not present

## 2024-04-10 DIAGNOSIS — R1013 Epigastric pain: Secondary | ICD-10-CM | POA: Insufficient documentation

## 2024-04-10 LAB — CBC
HCT: 42.3 % (ref 36.0–46.0)
Hemoglobin: 12.8 g/dL (ref 12.0–15.0)
MCH: 22.7 pg — ABNORMAL LOW (ref 26.0–34.0)
MCHC: 30.3 g/dL (ref 30.0–36.0)
MCV: 75 fL — ABNORMAL LOW (ref 80.0–100.0)
Platelets: 426 10*3/uL — ABNORMAL HIGH (ref 150–400)
RBC: 5.64 MIL/uL — ABNORMAL HIGH (ref 3.87–5.11)
RDW: 15.7 % — ABNORMAL HIGH (ref 11.5–15.5)
WBC: 10.9 10*3/uL — ABNORMAL HIGH (ref 4.0–10.5)
nRBC: 0 % (ref 0.0–0.2)

## 2024-04-10 LAB — COMPREHENSIVE METABOLIC PANEL WITH GFR
ALT: 19 U/L (ref 0–44)
AST: 31 U/L (ref 15–41)
Albumin: 3.4 g/dL — ABNORMAL LOW (ref 3.5–5.0)
Alkaline Phosphatase: 72 U/L (ref 38–126)
Anion gap: 9 (ref 5–15)
BUN: 7 mg/dL (ref 6–20)
CO2: 22 mmol/L (ref 22–32)
Calcium: 8.4 mg/dL — ABNORMAL LOW (ref 8.9–10.3)
Chloride: 107 mmol/L (ref 98–111)
Creatinine, Ser: 0.67 mg/dL (ref 0.44–1.00)
GFR, Estimated: >60 mL/min (ref >60)
Glucose, Bld: 101 mg/dL — ABNORMAL HIGH (ref 70–99)
Potassium: 3 mmol/L — ABNORMAL LOW (ref 3.5–5.1)
Sodium: 138 mmol/L (ref 135–145)
Total Bilirubin: 0.4 mg/dL (ref 0.0–1.2)
Total Protein: 6.4 g/dL — ABNORMAL LOW (ref 6.5–8.1)

## 2024-04-10 LAB — TROPONIN I (HIGH SENSITIVITY): Troponin I (High Sensitivity): 4 ng/L (ref <18)

## 2024-04-10 LAB — LIPASE, BLOOD: Lipase: 29 U/L (ref 11–51)

## 2024-04-10 LAB — HCG, QUANTITATIVE, PREGNANCY: hCG, Beta Chain, Quant, S: 1 m[IU]/mL (ref <5)

## 2024-04-10 MED ORDER — DROPERIDOL 2.5 MG/ML IJ SOLN
2.5000 mg | Freq: Once | INTRAMUSCULAR | Status: AC
  Administered 2024-04-10: 2.5 mg via INTRAVENOUS
  Filled 2024-04-10: qty 2

## 2024-04-10 MED ORDER — ONDANSETRON 4 MG PO TBDP: 4.0000 mg | ORAL_TABLET | Freq: Three times a day (TID) | ORAL | 0 refills | Status: AC | PRN

## 2024-04-10 MED ORDER — FENTANYL CITRATE PF 50 MCG/ML IJ SOSY
50.0000 ug | PREFILLED_SYRINGE | Freq: Once | INTRAMUSCULAR | Status: AC
  Administered 2024-04-10: 50 ug via INTRAVENOUS
  Filled 2024-04-10: qty 1

## 2024-04-10 MED ORDER — ONDANSETRON HCL 4 MG/2ML IJ SOLN
4.0000 mg | Freq: Once | INTRAMUSCULAR | Status: AC
  Administered 2024-04-10: 4 mg via INTRAVENOUS
  Filled 2024-04-10: qty 2

## 2024-04-10 MED ORDER — POTASSIUM CHLORIDE CRYS ER 20 MEQ PO TBCR
20.0000 meq | EXTENDED_RELEASE_TABLET | Freq: Once | ORAL | Status: AC
  Administered 2024-04-10: 20 meq via ORAL
  Filled 2024-04-10: qty 1

## 2024-04-10 MED ORDER — OMEPRAZOLE MAGNESIUM 20 MG PO TBEC
20.0000 mg | DELAYED_RELEASE_TABLET | Freq: Every day | ORAL | 0 refills | Status: AC
Start: 2024-04-10 — End: 2024-05-10

## 2024-04-10 MED ORDER — OMEPRAZOLE MAGNESIUM 20 MG PO TBEC: 20.0000 mg | DELAYED_RELEASE_TABLET | Freq: Every day | ORAL | 0 refills | Status: DC

## 2024-04-10 MED ORDER — ONDANSETRON 4 MG PO TBDP: 4.0000 mg | ORAL_TABLET | Freq: Three times a day (TID) | ORAL | 0 refills | Status: DC | PRN

## 2024-04-10 MED ORDER — PANTOPRAZOLE SODIUM 40 MG IV SOLR
40.0000 mg | Freq: Once | INTRAVENOUS | Status: AC
  Administered 2024-04-10: 40 mg via INTRAVENOUS
  Filled 2024-04-10: qty 10

## 2024-04-10 MED ORDER — SODIUM CHLORIDE 0.9 % IV BOLUS
1000.0000 mL | Freq: Once | INTRAVENOUS | Status: AC
  Administered 2024-04-10: 1000 mL via INTRAVENOUS

## 2024-04-10 NOTE — ED Triage Notes (Signed)
 Pt states emesis since Friday, pt states it started after donating blood. No sick contacts.

## 2024-04-10 NOTE — Discharge Instructions (Signed)
 You were seen in the emergency department for nausea and vomiting and not feeling well.  Your lab work was overall reassuring but you did have a mildly low potassium level.  You are given IV fluids, nausea medicines and acid reducing medications in the emergency department.  You are given an oral dose of potassium.  Stay hydrated and drink plenty of fluids.  You are given a referral for primary care.  Return to the emergency department for any return or worsening of symptoms.  Your potassium was mildly low when checked today.  Make sure to follow up with a primary doctor to follow up your labs.  Make sure to eat food high in potassium and magnesium - examples - potatoes, spinach, bananas, beans, avocadoes, oranges, nuts.  zofran  (ondansetron ) - nausea medication, take 1 tablet every 8 hours as needed for nausea/vomiting.

## 2024-04-10 NOTE — ED Notes (Signed)
 Patient given crackers and water at this time for po challenge.

## 2024-04-10 NOTE — ED Provider Notes (Signed)
 [facility] Provider Note    Event Date/Time   First MD Initiated Contact with Patient 04/10/24 0940     (approximate)   History   Emesis   HPI  Jodi Tran is a 43 y.o. female no significant past medical history presents to the emergency department with nausea vomiting and not feeling well.  States that she has not been feeling well since Friday with nausea, vomiting and diarrhea.  States that she is having epigastric pain and pain in her chest.  Denies fever or chills.  Denies dysuria, urinary urgency or frequency.  Does endorse daily marijuana use.  Denies any alcohol use.  Does state that she takes NSAIDs on a regular basis.  Prior cholecystectomy.  Currently on her menstrual cycle and has a low concern for pregnancy.  Denies any falls or trauma.     Physical Exam   Triage Vital Signs: ED Triage Vitals  Encounter Vitals Group     BP 04/10/24 0938 (!) 120/100     Systolic BP Percentile --      Diastolic BP Percentile --      Pulse Rate 04/10/24 0938 84     Resp 04/10/24 0938 18     Temp 04/10/24 0938 98 F (36.7 C)     Temp Source 04/10/24 0938 Oral     SpO2 04/10/24 0938 100 %     Weight 04/10/24 0939 151 lb 0.2 oz (68.5 kg)     Height 04/10/24 0939 4\' 11"  (1.499 m)     Head Circumference --      Peak Flow --      Pain Score 04/10/24 0938 6     Pain Loc --      Pain Education --      Exclude from Growth Chart --     Most recent vital signs: Vitals:   04/10/24 1041 04/10/24 1100  BP: 126/64 119/72  Pulse: 75 69  Resp: 20 20  Temp:    SpO2: 100% 100%    Physical Exam Constitutional:      General: She is in acute distress.     Appearance: She is well-developed.  HENT:     Head: Atraumatic.  Eyes:     Conjunctiva/sclera: Conjunctivae normal.  Cardiovascular:     Rate and Rhythm: Regular rhythm.  Pulmonary:     Effort: No respiratory distress.  Abdominal:     General: There is no distension.     Tenderness: There is  abdominal tenderness.     Comments: Epigastric tenderness to palpation.  No lower abdominal tenderness to palpation.  No CVA tenderness.  No rebound or guarding.  Musculoskeletal:        General: Normal range of motion.     Cervical back: Normal range of motion.     Right lower leg: No edema.     Left lower leg: No edema.  Skin:    General: Skin is warm.     Capillary Refill: Capillary refill takes less than 2 seconds.  Neurological:     Mental Status: She is alert. Mental status is at baseline.  Psychiatric:        Mood and Affect: Mood normal.     IMPRESSION / MDM / ASSESSMENT AND PLAN / ED COURSE  I reviewed the triage vital signs and the nursing notes.  Differential diagnosis including viral gastroenteritis, peptic ulcer disease/PUD, cyclical vomiting syndrome, pancreatitis, ACS  EKG  I, Viviano Ground, the attending physician, personally viewed and  interpreted this ECG.   Rate: Normal  Rhythm: Normal sinus  Axis: Normal  Intervals: Normal  ST&T Change: None  No tachycardic or bradycardic dysrhythmias while on cardiac telemetry.   LABS (all labs ordered are listed, but only abnormal results are displayed) Labs interpreted as -    Labs Reviewed  COMPREHENSIVE METABOLIC PANEL WITH GFR - Abnormal; Notable for the following components:      Result Value   Potassium 3.0 (*)    Glucose, Bld 101 (*)    Calcium 8.4 (*)    Total Protein 6.4 (*)    Albumin 3.4 (*)    All other components within normal limits  CBC - Abnormal; Notable for the following components:   WBC 10.9 (*)    RBC 5.64 (*)    MCV 75.0 (*)    MCH 22.7 (*)    RDW 15.7 (*)    Platelets 426 (*)    All other components within normal limits  LIPASE, BLOOD  URINALYSIS, ROUTINE W REFLEX MICROSCOPIC  HCG, QUANTITATIVE, PREGNANCY  POC URINE PREG, ED  TROPONIN I (HIGH SENSITIVITY)     MDM  Patient given IV fluids, antiemetics and Protonix.  Given fentanyl  for pain control.  Plan for lab work will  reevaluate.   Clinical Course as of 04/10/24 1229  Sun Apr 10, 2024  1031 Ongoing nausea and epigastric pain.  Will obtain an EKG and give IV droperidol for her intractable nausea and vomiting [SM]    Clinical Course User Index [SM] Viviano Ground, MD   On reevaluation patient states she is feeling much better.  Laying in bed comfortably, able to tolerate crackers and water.  Able to keep down her potassium tablet.  States she is feeling much better but continues to have some mild nausea.  Lab work overall reassuring with no significant leukocytosis.  No significant anemia.  Creatinine appears to be at her baseline.  Troponin is negative have low suspicion for ACS.  Most likely with viral gastroenteritis versus a cyclical vomiting syndrome.  Will give a referral for primary care follow-up and discussed return to the emergency department for any worsening symptoms.  PROCEDURES:  Critical Care performed: No  Procedures  Patient's presentation is most consistent with acute presentation with potential threat to life or bodily function.   MEDICATIONS ORDERED IN ED: Medications  sodium chloride  0.9 % bolus 1,000 mL (0 mLs Intravenous Stopped 04/10/24 1041)  ondansetron  (ZOFRAN ) injection 4 mg (4 mg Intravenous Given 04/10/24 0958)  fentaNYL  (SUBLIMAZE ) injection 50 mcg (50 mcg Intravenous Given 04/10/24 0958)  pantoprazole (PROTONIX) injection 40 mg (40 mg Intravenous Given 04/10/24 0958)  droperidol (INAPSINE) 2.5 MG/ML injection 2.5 mg (2.5 mg Intravenous Given 04/10/24 1038)  ondansetron  (ZOFRAN ) injection 4 mg (4 mg Intravenous Given 04/10/24 1127)  potassium chloride  SA (KLOR-CON  M) CR tablet 20 mEq (20 mEq Oral Given 04/10/24 1131)    FINAL CLINICAL IMPRESSION(S) / ED DIAGNOSES   Final diagnoses:  Nausea vomiting and diarrhea  Hypokalemia     Rx / DC Orders   ED Discharge Orders          Ordered    ondansetron  (ZOFRAN -ODT) 4 MG disintegrating tablet  Every 8 hours PRN,   Status:   Discontinued        04/10/24 1128    omeprazole (PRILOSEC OTC) 20 MG tablet  Daily,   Status:  Discontinued        04/10/24 1128    omeprazole (PRILOSEC OTC) 20 MG tablet  Daily        04/10/24 1227    ondansetron  (ZOFRAN -ODT) 4 MG disintegrating tablet  Every 8 hours PRN        04/10/24 1227    Ambulatory Referral to Primary Care (Establish Care)        04/10/24 1229             Note:  This document was prepared using Dragon voice recognition software and may include unintentional dictation errors.   Viviano Ground, MD 04/10/24 1229

## 2024-04-13 ENCOUNTER — Emergency Department
Admission: EM | Admit: 2024-04-13 | Discharge: 2024-04-13 | Disposition: A | Discharge status: Home / Self Care | Attending: Emergency Medicine | Admitting: Emergency Medicine

## 2024-04-13 ENCOUNTER — Encounter: Payer: Self-pay | Admitting: Emergency Medicine

## 2024-04-13 ENCOUNTER — Ambulatory Visit: Admission: EM | Admit: 2024-04-13 | Discharge: 2024-04-13 | Disposition: A | Discharge status: Home / Self Care

## 2024-04-13 ENCOUNTER — Other Ambulatory Visit: Payer: Self-pay

## 2024-04-13 DIAGNOSIS — R112 Nausea with vomiting, unspecified: Secondary | ICD-10-CM | POA: Diagnosis present

## 2024-04-13 DIAGNOSIS — R55 Syncope and collapse: Secondary | ICD-10-CM

## 2024-04-13 DIAGNOSIS — R42 Dizziness and giddiness: Secondary | ICD-10-CM

## 2024-04-13 DIAGNOSIS — E876 Hypokalemia: Secondary | ICD-10-CM | POA: Diagnosis not present

## 2024-04-13 DIAGNOSIS — K219 Gastro-esophageal reflux disease without esophagitis: Secondary | ICD-10-CM

## 2024-04-13 DIAGNOSIS — J45909 Unspecified asthma, uncomplicated: Secondary | ICD-10-CM | POA: Insufficient documentation

## 2024-04-13 DIAGNOSIS — R531 Weakness: Secondary | ICD-10-CM

## 2024-04-13 DIAGNOSIS — R0682 Tachypnea, not elsewhere classified: Secondary | ICD-10-CM

## 2024-04-13 LAB — CBC
HCT: 43.6 % (ref 36.0–46.0)
Hemoglobin: 13.8 g/dL (ref 12.0–15.0)
MCH: 22.9 pg — ABNORMAL LOW (ref 26.0–34.0)
MCHC: 31.7 g/dL (ref 30.0–36.0)
MCV: 72.4 fL — ABNORMAL LOW (ref 80.0–100.0)
Platelets: 401 10*3/uL — ABNORMAL HIGH (ref 150–400)
RBC: 6.02 MIL/uL — ABNORMAL HIGH (ref 3.87–5.11)
RDW: 15.3 % (ref 11.5–15.5)
WBC: 10.1 10*3/uL (ref 4.0–10.5)
nRBC: 0 % (ref 0.0–0.2)

## 2024-04-13 LAB — COMPREHENSIVE METABOLIC PANEL WITH GFR
ALT: 30 U/L (ref 0–44)
AST: 31 U/L (ref 15–41)
Albumin: 3.9 g/dL (ref 3.5–5.0)
Alkaline Phosphatase: 69 U/L (ref 38–126)
Anion gap: 15 (ref 5–15)
BUN: 9 mg/dL (ref 6–20)
CO2: 23 mmol/L (ref 22–32)
Calcium: 9.1 mg/dL (ref 8.9–10.3)
Chloride: 98 mmol/L (ref 98–111)
Creatinine, Ser: 0.76 mg/dL (ref 0.44–1.00)
GFR, Estimated: >60 mL/min (ref >60)
Glucose, Bld: 85 mg/dL (ref 70–99)
Potassium: 3.1 mmol/L — ABNORMAL LOW (ref 3.5–5.1)
Sodium: 136 mmol/L (ref 135–145)
Total Bilirubin: 1 mg/dL (ref 0.0–1.2)
Total Protein: 6.9 g/dL (ref 6.5–8.1)

## 2024-04-13 LAB — URINALYSIS, ROUTINE W REFLEX MICROSCOPIC
Bilirubin Urine: NEGATIVE
Glucose, UA: NEGATIVE mg/dL
Hgb urine dipstick: NEGATIVE
Ketones, ur: 20 mg/dL — AB
Leukocytes,Ua: NEGATIVE
Nitrite: NEGATIVE
Protein, ur: NEGATIVE mg/dL
Specific Gravity, Urine: 1.019 (ref 1.005–1.030)
pH: 5 (ref 5.0–8.0)

## 2024-04-13 LAB — LIPASE, BLOOD: Lipase: 23 U/L (ref 11–51)

## 2024-04-13 MED ORDER — DICYCLOMINE HCL 10 MG/5ML PO SOLN
10.0000 mg | Freq: Once | ORAL | Status: AC
  Administered 2024-04-13: 10 mg via ORAL
  Filled 2024-04-13 (×2): qty 5

## 2024-04-13 MED ORDER — PROCHLORPERAZINE MALEATE 10 MG PO TABS: 10.0000 mg | ORAL_TABLET | Freq: Four times a day (QID) | ORAL | 0 refills | Status: AC | PRN

## 2024-04-13 MED ORDER — PROCHLORPERAZINE EDISYLATE 10 MG/2ML IJ SOLN
10.0000 mg | Freq: Once | INTRAMUSCULAR | Status: AC
Start: 2024-04-13 — End: 2024-04-13
  Administered 2024-04-13: 10 mg via INTRAVENOUS
  Filled 2024-04-13: qty 2

## 2024-04-13 MED ORDER — ALUM & MAG HYDROXIDE-SIMETH 200-200-20 MG/5ML PO SUSP
30.0000 mL | Freq: Once | ORAL | Status: AC
  Administered 2024-04-13: 30 mL via ORAL
  Filled 2024-04-13: qty 30

## 2024-04-13 MED ORDER — SODIUM CHLORIDE 0.9 % IV BOLUS
1000.0000 mL | Freq: Once | INTRAVENOUS | Status: AC
  Administered 2024-04-13: 1000 mL via INTRAVENOUS

## 2024-04-13 NOTE — Discharge Instructions (Signed)
 Take the Maalox to help with with the burning sensation in your upper stomach and chest.  Take the Compazine as prescribed which should help with the abdominal pain and nausea.  Follow-up with the GI specialist if this is not improving over the next few days.  If your symptoms change or worsen and you are unable to see primary care or the specialist, please return to the emergency department.

## 2024-04-13 NOTE — ED Triage Notes (Signed)
 Patient to ED via [facility] from [facility] for N/V. Seen for same on 5/18. Dx with hyperemesis. States she hasn't smoke marijuana in 2 weeks and a cigarette in 1 week.  States able to keep down water.

## 2024-04-13 NOTE — ED Notes (Signed)
 Pt unable to void at this time.

## 2024-04-13 NOTE — ED Provider Notes (Signed)
 [facility] Provider Note    Event Date/Time   First MD Initiated Contact with Patient 04/13/24 1649     (approximate)   History   Nausea   HPI  Jodi Tran is a 43 y.o. female with history of asthma and as listed in EMR presents to the emergency department for treatment and evaluation of nausea, abdominal pain, and chest pain.  She was sent to the emergency department by urgent care due to complaint of feeling as if she were going to pass out.  She has been evaluated for the symptoms 2 other times within the past few weeks.  She states that symptoms started after donating plasma.  She has had 2 episodes of vomiting today and no diarrhea.  Appetite is poor.  No relief with Phenergan or Zofran .      Physical Exam   Triage Vital Signs: ED Triage Vitals  Encounter Vitals Group     BP 04/13/24 1529 (!) 145/95     Systolic BP Percentile --      Diastolic BP Percentile --      Pulse Rate 04/13/24 1528 (!) 59     Resp 04/13/24 1528 17     Temp 04/13/24 1528 97.7 F (36.5 C)     Temp Source 04/13/24 1528 Oral     SpO2 04/13/24 1528 100 %     Weight 04/13/24 1529 149 lb 14.6 oz (68 kg)     Height 04/13/24 1529 4\' 11"  (1.499 m)     Head Circumference --      Peak Flow --      Pain Score 04/13/24 1529 5     Pain Loc --      Pain Education --      Exclude from Growth Chart --     Most recent vital signs: Vitals:   04/13/24 1528 04/13/24 1529  BP:  (!) 145/95  Pulse: (!) 59   Resp: 17   Temp: 97.7 F (36.5 C)   SpO2: 100%     General: Awake, no distress.  CV:  Good peripheral perfusion.  Resp:  Normal effort.  Soft.  No rebound tenderness. Abd:  No distention.  Other:     ED Results / Procedures / Treatments   Labs (all labs ordered are listed, but only abnormal results are displayed) Labs Reviewed  COMPREHENSIVE METABOLIC PANEL WITH GFR - Abnormal; Notable for the following components:      Result Value   Potassium 3.1 (*)    All  other components within normal limits  CBC - Abnormal; Notable for the following components:   RBC 6.02 (*)    MCV 72.4 (*)    MCH 22.9 (*)    Platelets 401 (*)    All other components within normal limits  URINALYSIS, ROUTINE W REFLEX MICROSCOPIC - Abnormal; Notable for the following components:   Color, Urine YELLOW (*)    APPearance CLEAR (*)    Ketones, ur 20 (*)    All other components within normal limits  LIPASE, BLOOD  POC URINE PREG, ED     EKG  Not indicated   RADIOLOGY  Image and radiology report reviewed and interpreted by me. Radiology report consistent with the same.  Not indicated  PROCEDURES:  Critical Care performed: No  Procedures   MEDICATIONS ORDERED IN ED:  Medications  sodium chloride  0.9 % bolus 1,000 mL (1,000 mLs Intravenous New Bag/Given 04/13/24 1803)  prochlorperazine (COMPAZINE) injection 10 mg (10 mg Intravenous  Given 04/13/24 1804)  alum & mag hydroxide-simeth (MAALOX/MYLANTA) 200-200-20 MG/5ML suspension 30 mL (30 mLs Oral Given 04/13/24 1840)  dicyclomine (BENTYL) 10 MG/5ML solution 10 mg (10 mg Oral Given 04/13/24 1839)     IMPRESSION / MDM / ASSESSMENT AND PLAN / ED COURSE   I have reviewed the triage note.  Differential diagnosis includes, but is not limited to, cyclical vomiting, gastritis, viral syndrome  Patient's presentation is most consistent with acute illness / injury with system symptoms.  43 year old female presenting to the emergency department for treatment and evaluation of nausea and vomiting.  See HPI for further details.  Patient states that the sensation of near syncope happens because if she breathes fast the nausea is less but she causes herself to hyperventilate.  When this happens she sees dots and her hands get tingly.  At this time, she does not feel as if she is going to pass out.  Abdominal pain is epigastric radiates up into the center of the chest wall and into her throat.  It also goes across the chest  under her breasts.  She describes this as a burning sensation.  She feels this is related to vomiting.  EKG from urgent care reviewed.  Sinus bradycardia at 58 bpm noted.  No ectopy.  Urgent care chart and previous ER chart reviewed.  Labs obtained are overall reassuring.  She does have mild hypokalemia at 3.1, but she does not feel that she can tolerate swallowing potassium pill.  Once she is able to eat this is likely to self-correct.  Urinalysis is negative for acute cystitis but does show 20 of ketones.  IV fluids are currently infusing which should correct this.  ----------------------------------------- 7:55 PM on 04/13/2024 ----------------------------------------- Upon reassessment, patient's symptoms have resolved and she is requesting discharge.  She will be prescribed Compazine and advised to take Maalox as directed on the bottle when needed.  She was given information for GI follow-up.  She is to schedule an appointment if she is not improving over the next couple of days.  She is to see primary care or return to the emergency department if her symptoms change or worsen.Aaron Aas    FINAL CLINICAL IMPRESSION(S) / ED DIAGNOSES   Final diagnoses:  Nausea and vomiting, unspecified vomiting type  Gastroesophageal reflux disease, unspecified whether esophagitis present     Rx / DC Orders   ED Discharge Orders          Ordered    prochlorperazine (COMPAZINE) 10 MG tablet  Every 6 hours PRN        04/13/24 1950             Note:  This document was prepared using Dragon voice recognition software and may include unintentional dictation errors.   Sherryle Don, FNP 04/13/24 1956    Kandee Orion, MD 04/13/24 2138

## 2024-04-13 NOTE — ED Provider Notes (Signed)
 Jodi Tran    CSN: [id number] Arrival date & time: 04/13/24  1434      History   Chief Complaint No chief complaint on file.   HPI Jodi Tran is a 43 y.o. female.  Patient presents with abdominal pain, nausea, vomiting, diarrhea x 5 days.  She has generalized weakness, dizziness, feeling like she is going to pass out today.  No focal weakness, chest pain, shortness of breath.  She was seen at [facility] ED on 04/11/2024; diagnosed with nausea and vomiting, abdominal pain, rash.  She was seen at [facility] ED on 04/10/2024; diagnosed with nausea, vomiting, diarrhea, hypokalemia.  The history is provided by the patient and medical records.    Past Medical History:  Diagnosis Date   Asthma     Patient Active Problem List   Diagnosis Date Noted   Mastitis 02/18/2023   Asthma, chronic 02/18/2023   Tobacco use 02/18/2023    Past Surgical History:  Procedure Laterality Date   CHOLECYSTECTOMY      OB History   No obstetric history on file.      Home Medications    Prior to Admission medications   Medication Sig Start Date End Date Taking? Authorizing Provider  albuterol  (PROVENTIL  HFA;VENTOLIN  HFA) 108 (90 Base) MCG/ACT inhaler Inhale 2 puffs into the lungs every 4 (four) hours as needed for wheezing.    [provider]  carisoprodol  (SOMA ) 350 MG tablet Take 1 tablet (350 mg total) by mouth 3 (three) times daily as needed. 08/08/18   Schaevitz, Mancel Seashore, MD  chlorhexidine  (PERIDEX ) 0.12 % solution Use as directed 15 mLs in the mouth or throat 2 (two) times daily. 01/17/22   Art Bigness, PA  omeprazole  (PRILOSEC  OTC) 20 MG tablet Take 1 tablet (20 mg total) by mouth daily. 04/10/24 05/10/24  Viviano Ground, MD  ondansetron  (ZOFRAN -ODT) 4 MG disintegrating tablet Take 1 tablet (4 mg total) by mouth every 8 (eight) hours as needed for nausea or vomiting. 04/10/24   Viviano Ground, MD    Family History No family history on file.  Social  History Social History   Tobacco Use   Smoking status: Every Day    Current packs/day: 0.50    Types: Cigarettes   Smokeless tobacco: Never  Vaping Use   Vaping status: Never Used  Substance Use Topics   Alcohol use: No   Drug use: No     Allergies   Bactrim [sulfamethoxazole-trimethoprim]   Review of Systems Review of Systems  Constitutional:  Negative for chills and fever.  Respiratory:  Negative for cough and shortness of breath.   Cardiovascular:  Negative for chest pain and palpitations.  Gastrointestinal:  Positive for abdominal pain, diarrhea, nausea and vomiting.  Neurological:  Positive for dizziness and weakness. Negative for syncope and numbness.     Physical Exam Triage Vital Signs ED Triage Vitals  Encounter Vitals Group     BP      Systolic BP Percentile      Diastolic BP Percentile      Pulse      Resp      Temp      Temp src      SpO2      Weight      Height      Head Circumference      Peak Flow      Pain Score      Pain Loc      Pain Education  Exclude from Growth Chart    No data found.  Updated Vital Signs BP (!) 146/87   Pulse 61   Temp 98 F (36.7 C)   Resp (!) 40   LMP  (LMP Unknown)   SpO2 99%   Visual Acuity Right Eye Distance:   Left Eye Distance:   Bilateral Distance:    Right Eye Near:   Left Eye Near:    Bilateral Near:     Physical Exam Constitutional:      General: She is not in acute distress.    Appearance: She is ill-appearing.  HENT:     Mouth/Throat:     Mouth: Mucous membranes are dry.  Cardiovascular:     Rate and Rhythm: Normal rate and regular rhythm.     Heart sounds: Normal heart sounds.  Pulmonary:     Effort: Pulmonary effort is normal. Tachypnea present. No respiratory distress.  Abdominal:     General: Bowel sounds are normal.     Palpations: Abdomen is soft.     Tenderness: There is no abdominal tenderness. There is no guarding or rebound.  Neurological:     General: No focal  deficit present.     Mental Status: She is alert and oriented to person, place, and time.     Sensory: No sensory deficit.     Motor: No weakness.     Gait: Gait normal.      UC Treatments / Results  Labs (all labs ordered are listed, but only abnormal results are displayed) Labs Reviewed - No data to display  EKG   Radiology No results found.  Procedures Procedures (including critical care time)  Medications Ordered in UC Medications - No data to display  Initial Impression / Assessment and Plan / UC Course  I have reviewed the triage vital signs and the nursing notes.  Pertinent labs & imaging results that were available during my care of the patient were reviewed by me and considered in my medical decision making (see chart for details).    Dizziness, near syncope, generalized weakness, tachypnea.  Patient reports she feels like she is going to pass out while in exam room.  EKG shows sinus bradycardia, rate 58, no ST elevation, compared to previous from 04/11/2024 and 04/10/2024.  Sending patient to the ED via EMS.  Final Clinical Impressions(s) / UC Diagnoses   Final diagnoses:  Dizziness  Near syncope  Generalized weakness  Tachypnea   Discharge Instructions   None    ED Prescriptions   None    PDMP not reviewed this encounter.   Wellington Half, NP 04/13/24 1500

## 2024-07-13 ENCOUNTER — Other Ambulatory Visit: Payer: Self-pay

## 2024-07-13 ENCOUNTER — Emergency Department

## 2024-07-13 DIAGNOSIS — F17200 Nicotine dependence, unspecified, uncomplicated: Secondary | ICD-10-CM | POA: Insufficient documentation

## 2024-07-13 DIAGNOSIS — J45909 Unspecified asthma, uncomplicated: Secondary | ICD-10-CM | POA: Diagnosis not present

## 2024-07-13 DIAGNOSIS — R112 Nausea with vomiting, unspecified: Secondary | ICD-10-CM | POA: Diagnosis present

## 2024-07-13 DIAGNOSIS — K219 Gastro-esophageal reflux disease without esophagitis: Secondary | ICD-10-CM | POA: Insufficient documentation

## 2024-07-13 LAB — BASIC METABOLIC PANEL WITH GFR
Anion gap: 13 (ref 5–15)
BUN: 9 mg/dL (ref 6–20)
CO2: 25 mmol/L (ref 22–32)
Calcium: 9.3 mg/dL (ref 8.9–10.3)
Chloride: 98 mmol/L (ref 98–111)
Creatinine, Ser: 0.71 mg/dL (ref 0.44–1.00)
GFR, Estimated: >60 mL/min (ref >60)
Glucose, Bld: 106 mg/dL — ABNORMAL HIGH (ref 70–99)
Potassium: 3.7 mmol/L (ref 3.5–5.1)
Sodium: 136 mmol/L (ref 135–145)

## 2024-07-13 LAB — CBC
HCT: 35.9 % — ABNORMAL LOW (ref 36.0–46.0)
Hemoglobin: 10.9 g/dL — ABNORMAL LOW (ref 12.0–15.0)
MCH: 20.3 pg — ABNORMAL LOW (ref 26.0–34.0)
MCHC: 30.4 g/dL (ref 30.0–36.0)
MCV: 66.7 fL — ABNORMAL LOW (ref 80.0–100.0)
Platelets: 509 K/uL — ABNORMAL HIGH (ref 150–400)
RBC: 5.38 MIL/uL — ABNORMAL HIGH (ref 3.87–5.11)
RDW: 15.9 % — ABNORMAL HIGH (ref 11.5–15.5)
WBC: 13.3 K/uL — ABNORMAL HIGH (ref 4.0–10.5)
nRBC: 0 % (ref 0.0–0.2)

## 2024-07-13 LAB — TROPONIN I (HIGH SENSITIVITY): Troponin I (High Sensitivity): 7 ng/L (ref <18)

## 2024-07-13 NOTE — ED Triage Notes (Signed)
 Pt arrives with c/o n/v that started about 2 days ago. Pt reports having a lump on the right side of her neck that radiates pain into her right shoulder. Pt endorses burning sensation in her chest as well. Pt denies fevers.

## 2024-07-14 ENCOUNTER — Emergency Department
Admission: EM | Admit: 2024-07-14 | Discharge: 2024-07-14 | Disposition: A | Discharge status: Home / Self Care | Attending: Emergency Medicine | Admitting: Emergency Medicine

## 2024-07-14 DIAGNOSIS — K219 Gastro-esophageal reflux disease without esophagitis: Secondary | ICD-10-CM

## 2024-07-14 LAB — LIPASE, BLOOD: Lipase: 25 U/L (ref 11–51)

## 2024-07-14 LAB — HEPATIC FUNCTION PANEL
ALT: 14 U/L (ref 0–44)
AST: 27 U/L (ref 15–41)
Albumin: 3.8 g/dL (ref 3.5–5.0)
Alkaline Phosphatase: 105 U/L (ref 38–126)
Bilirubin, Direct: <0.1 mg/dL (ref 0.0–0.2)
Total Bilirubin: <0.2 mg/dL (ref 0.0–1.2)
Total Protein: 7.3 g/dL (ref 6.5–8.1)

## 2024-07-14 MED ORDER — ALUM & MAG HYDROXIDE-SIMETH 200-200-20 MG/5ML PO SUSP
30.0000 mL | Freq: Once | ORAL | Status: AC
  Administered 2024-07-14: 30 mL via ORAL
  Filled 2024-07-14: qty 30

## 2024-07-14 MED ORDER — METOCLOPRAMIDE HCL 10 MG PO TABS: 10.0000 mg | ORAL_TABLET | Freq: Four times a day (QID) | ORAL | 0 refills | Status: AC | PRN

## 2024-07-14 MED ORDER — METOCLOPRAMIDE HCL 10 MG PO TABS
10.0000 mg | ORAL_TABLET | Freq: Once | ORAL | Status: AC
  Administered 2024-07-14: 10 mg via ORAL
  Filled 2024-07-14: qty 1

## 2024-07-14 MED ORDER — FAMOTIDINE 20 MG PO TABS
20.0000 mg | ORAL_TABLET | Freq: Two times a day (BID) | ORAL | 0 refills | Status: AC
Start: 2024-07-14 — End: ?

## 2024-07-14 MED ORDER — FAMOTIDINE 20 MG PO TABS
40.0000 mg | ORAL_TABLET | Freq: Once | ORAL | Status: AC
  Administered 2024-07-14: 40 mg via ORAL
  Filled 2024-07-14: qty 2

## 2024-07-14 MED ORDER — ALUMINUM-MAGNESIUM-SIMETHICONE 200-200-20 MG/5ML PO SUSP: 30.0000 mL | Freq: Three times a day (TID) | ORAL | 0 refills | Status: AC

## 2024-07-14 NOTE — ED Provider Notes (Signed)
 [facility] Provider Note    Event Date/Time   First MD Initiated Contact with Patient 07/14/24 0155     (approximate)   History   Chief Complaint: Emesis   HPI  Jodi Tran is a 43 y.o. female with a history of asthma, smoking who comes ED complaining of nausea and vomiting for the past 2 days, associated with a burning sensation in the upper abdomen that is worse with eating.  No specific chest pain or shortness of breath.  No exertional symptoms.  Also complains of some pain in the right side of the neck which is intermittent.        Past Medical History:  Diagnosis Date   Asthma     Current Outpatient Rx   Order #: [id number] Class: Normal   Order #: [id number] Class: Normal   Order #: [id number] Class: Normal   Order #: [id number] Class: Historical Med   Order #: [id number] Class: Print   Order #: [id number] Class: Normal   Order #: [id number] Class: Normal   Order #: [id number] Class: Normal   Order #: [id number] Class: Normal    Past Surgical History:  Procedure Laterality Date   CHOLECYSTECTOMY      Physical Exam   Triage Vital Signs: ED Triage Vitals  Encounter Vitals Group     BP 07/13/24 2255 108/80     Girls Systolic BP Percentile --      Girls Diastolic BP Percentile --      Boys Systolic BP Percentile --      Boys Diastolic BP Percentile --      Pulse Rate 07/13/24 2255 100     Resp 07/13/24 2255 19     Temp 07/13/24 2255 98.3 F (36.8 C)     Temp src --      SpO2 07/13/24 2255 100 %     Weight 07/13/24 2254 150 lb (68 kg)     Height --      Head Circumference --      Peak Flow --      Pain Score 07/13/24 2254 9     Pain Loc --      Pain Education --      Exclude from Growth Chart --     Most recent vital signs: Vitals:   07/14/24 0241 07/14/24 0242  BP: (!) 138/94   Pulse:  83  Resp:    Temp:    SpO2:  100%    General: Awake, no distress.  CV:  Good peripheral perfusion.  Regular rate rhythm Resp:  Normal  effort.  Clear to auscultation bilaterally Abd:  No distention.  Soft with mild epigastric and left upper quadrant tenderness. Other:  Poor dentition.  Moist oral mucosa.  Mild tenderness in the right side of the neck over the SCM reproducing neck pain without swelling or other inflammatory changes.   ED Results / Procedures / Treatments   Labs (all labs ordered are listed, but only abnormal results are displayed) Labs Reviewed  CBC - Abnormal; Notable for the following components:      Result Value   WBC 13.3 (*)    RBC 5.38 (*)    Hemoglobin 10.9 (*)    HCT 35.9 (*)    MCV 66.7 (*)    MCH 20.3 (*)    RDW 15.9 (*)    Platelets 509 (*)    All other components within normal limits  BASIC METABOLIC PANEL WITH GFR - Abnormal; Notable for the following components:   Glucose, Bld 106 (*)  All other components within normal limits  LIPASE, BLOOD  HEPATIC FUNCTION PANEL  POC URINE PREG, ED  TROPONIN I (HIGH SENSITIVITY)     EKG Interpreted by me Sinus rhythm rate of 88.  Normal axis intervals QRS ST segments T waves   RADIOLOGY Chest x-ray interpreted by me, unremarkable.  Radiology report reviewed   PROCEDURES:  Procedures   MEDICATIONS ORDERED IN ED: Medications  famotidine  (PEPCID ) tablet 40 mg (40 mg Oral Given 07/14/24 0237)  metoCLOPramide  (REGLAN ) tablet 10 mg (10 mg Oral Given 07/14/24 0237)  alum & mag hydroxide-simeth (MAALOX/MYLANTA) 200-200-20 MG/5ML suspension 30 mL (30 mLs Oral Given 07/14/24 0237)     IMPRESSION / MDM / ASSESSMENT AND PLAN / ED COURSE  I reviewed the triage vital signs and the nursing notes.  DDx: GERD/gastritis, pancreatitis, biliary disease, electrolyte derangement, AKI, dehydration, pneumonia  Patient's presentation is most consistent with acute presentation with potential threat to life or bodily function.  Patient presents with upper abdominal pain, mild tenderness, suspect GERD/gastritis.  Will give antacids, obtain lipase and  LFTs.  Initial serum labs chest x-ray EKG unremarkable.  Doubt ACS PE dissection AAA   ----------------------------------------- 3:25 AM on 07/14/2024 ----------------------------------------- Labs normal.  Symptoms resolved after antacids.  Stable for discharge      FINAL CLINICAL IMPRESSION(S) / ED DIAGNOSES   Final diagnoses:  Gastroesophageal reflux disease, unspecified whether esophagitis present     Rx / DC Orders   ED Discharge Orders          Ordered    aluminum -magnesium  hydroxide-simethicone  (MAALOX) 200-200-20 MG/5ML SUSP  3 times daily before meals & bedtime        07/14/24 0324    famotidine  (PEPCID ) 20 MG tablet  2 times daily        07/14/24 0324    metoCLOPramide  (REGLAN ) 10 MG tablet  Every 6 hours PRN        07/14/24 0324             Note:  This document was prepared using Dragon voice recognition software and may include unintentional dictation errors.   Viviann Pastor, MD 07/14/24 [phone]

## 2024-07-16 ENCOUNTER — Emergency Department

## 2024-07-16 ENCOUNTER — Other Ambulatory Visit: Payer: Self-pay

## 2024-07-16 ENCOUNTER — Emergency Department
Admission: EM | Admit: 2024-07-16 | Discharge: 2024-07-16 | Disposition: A | Discharge status: Home / Self Care | Attending: Emergency Medicine | Admitting: Emergency Medicine

## 2024-07-16 DIAGNOSIS — R112 Nausea with vomiting, unspecified: Secondary | ICD-10-CM | POA: Diagnosis not present

## 2024-07-16 DIAGNOSIS — J45909 Unspecified asthma, uncomplicated: Secondary | ICD-10-CM | POA: Insufficient documentation

## 2024-07-16 DIAGNOSIS — D72829 Elevated white blood cell count, unspecified: Secondary | ICD-10-CM | POA: Insufficient documentation

## 2024-07-16 DIAGNOSIS — R1031 Right lower quadrant pain: Secondary | ICD-10-CM | POA: Diagnosis present

## 2024-07-16 LAB — COMPREHENSIVE METABOLIC PANEL WITH GFR
ALT: 35 U/L (ref 0–44)
AST: 34 U/L (ref 15–41)
Albumin: 3.7 g/dL (ref 3.5–5.0)
Alkaline Phosphatase: 94 U/L (ref 38–126)
Anion gap: 8 (ref 5–15)
BUN: <5 mg/dL — ABNORMAL LOW (ref 6–20)
CO2: 26 mmol/L (ref 22–32)
Calcium: 9 mg/dL (ref 8.9–10.3)
Chloride: 101 mmol/L (ref 98–111)
Creatinine, Ser: 0.64 mg/dL (ref 0.44–1.00)
GFR, Estimated: >60 mL/min (ref >60)
Glucose, Bld: 121 mg/dL — ABNORMAL HIGH (ref 70–99)
Potassium: 3.6 mmol/L (ref 3.5–5.1)
Sodium: 135 mmol/L (ref 135–145)
Total Bilirubin: 0.7 mg/dL (ref 0.0–1.2)
Total Protein: 7.1 g/dL (ref 6.5–8.1)

## 2024-07-16 LAB — URINALYSIS, ROUTINE W REFLEX MICROSCOPIC
Bilirubin Urine: NEGATIVE
Glucose, UA: NEGATIVE mg/dL
Hgb urine dipstick: NEGATIVE
Ketones, ur: NEGATIVE mg/dL
Leukocytes,Ua: NEGATIVE
Nitrite: NEGATIVE
Protein, ur: NEGATIVE mg/dL
Specific Gravity, Urine: 1.004 — ABNORMAL LOW (ref 1.005–1.030)
pH: 7 (ref 5.0–8.0)

## 2024-07-16 LAB — CBC
HCT: 38.7 % (ref 36.0–46.0)
Hemoglobin: 11.5 g/dL — ABNORMAL LOW (ref 12.0–15.0)
MCH: 19.8 pg — ABNORMAL LOW (ref 26.0–34.0)
MCHC: 29.7 g/dL — ABNORMAL LOW (ref 30.0–36.0)
MCV: 66.5 fL — ABNORMAL LOW (ref 80.0–100.0)
Platelets: 478 K/uL — ABNORMAL HIGH (ref 150–400)
RBC: 5.82 MIL/uL — ABNORMAL HIGH (ref 3.87–5.11)
RDW: 16 % — ABNORMAL HIGH (ref 11.5–15.5)
WBC: 10.7 K/uL — ABNORMAL HIGH (ref 4.0–10.5)
nRBC: 0 % (ref 0.0–0.2)

## 2024-07-16 LAB — LIPASE, BLOOD: Lipase: 32 U/L (ref 11–51)

## 2024-07-16 LAB — POC URINE PREG, ED: Preg Test, Ur: NEGATIVE

## 2024-07-16 MED ORDER — SODIUM CHLORIDE 0.9 % IV BOLUS
1000.0000 mL | Freq: Once | INTRAVENOUS | Status: AC
  Administered 2024-07-16: 1000 mL via INTRAVENOUS

## 2024-07-16 MED ORDER — IOHEXOL 300 MG/ML  SOLN
100.0000 mL | Freq: Once | INTRAMUSCULAR | Status: AC | PRN
Start: 2024-07-16 — End: 2024-07-16
  Administered 2024-07-16: 100 mL via INTRAVENOUS

## 2024-07-16 MED ORDER — DICYCLOMINE HCL 10 MG PO CAPS: 10.0000 mg | ORAL_CAPSULE | Freq: Three times a day (TID) | ORAL | 0 refills | Status: AC | PRN

## 2024-07-16 MED ORDER — MORPHINE SULFATE (PF) 4 MG/ML IV SOLN
4.0000 mg | Freq: Once | INTRAVENOUS | Status: AC
  Administered 2024-07-16: 4 mg via INTRAVENOUS
  Filled 2024-07-16: qty 1

## 2024-07-16 MED ORDER — PROMETHAZINE HCL 12.5 MG PO TABS: 12.5000 mg | ORAL_TABLET | Freq: Four times a day (QID) | ORAL | 0 refills | Status: AC | PRN

## 2024-07-16 MED ORDER — ONDANSETRON HCL 4 MG/2ML IJ SOLN
4.0000 mg | Freq: Once | INTRAMUSCULAR | Status: AC
  Administered 2024-07-16: 4 mg via INTRAVENOUS
  Filled 2024-07-16: qty 2

## 2024-07-16 NOTE — ED Provider Notes (Signed)
 [facility] Provider Note    Event Date/Time   First MD Initiated Contact with Patient 07/16/24 1411     (approximate)  History   Chief Complaint: Abdominal Pain  HPI  Jodi Tran is a 43 y.o. female with a past medical history of asthma who presents to the emergency department for right lower quadrant abdominal pain nausea and vomiting.  According to the patient for the past 3 days she has been experiencing progressively worsening pain in the right lower quadrant along with intermittent nausea and vomiting.  Patient was seen in the emergency department 2 days ago for nausea vomiting but was not having any significant abdominal pain at that time.  Overall reassuring lab work and the patient was ultimately discharged home.  Patient has no upper abdominal symptoms states all of her pain is in the right lower quadrant to suprapubic area.  Denies any vaginal bleeding or discharge.  No fever.  Physical Exam   Triage Vital Signs: ED Triage Vitals  Encounter Vitals Group     BP 07/16/24 1319 126/82     Girls Systolic BP Percentile --      Girls Diastolic BP Percentile --      Boys Systolic BP Percentile --      Boys Diastolic BP Percentile --      Pulse Rate 07/16/24 1319 88     Resp 07/16/24 1319 18     Temp 07/16/24 1319 98.4 F (36.9 C)     Temp Source 07/16/24 1319 Oral     SpO2 07/16/24 1319 96 %     Weight 07/16/24 1321 170 lb (77.1 kg)     Height 07/16/24 1321 5' 2 (1.575 m)     Head Circumference --      Peak Flow --      Pain Score 07/16/24 1319 8     Pain Loc --      Pain Education --      Exclude from Growth Chart --     Most recent vital signs: Vitals:   07/16/24 1319  BP: 126/82  Pulse: 88  Resp: 18  Temp: 98.4 F (36.9 C)  SpO2: 96%    General: Awake, no distress.  CV:  Good peripheral perfusion.  Regular rate and rhythm  Resp:  Normal effort.  Equal breath sounds bilaterally.  Abd:  No distention.  Soft, mild to moderate  tenderness in the right lower quadrant no rebound or guarding.  Abdomen otherwise benign  ED Results / Procedures / Treatments   RADIOLOGY  I have reviewed and interpreted the CT images of the abdomen and pelvis.  I do not appreciate any significant abnormality on my evaluation. Radiology has read the CT scan is negative for acute abnormality.   MEDICATIONS ORDERED IN ED: Medications  sodium chloride  0.9 % bolus 1,000 mL (has no administration in time range)  ondansetron  (ZOFRAN ) injection 4 mg (has no administration in time range)  morphine  (PF) 4 MG/ML injection 4 mg (has no administration in time range)     IMPRESSION / MDM / ASSESSMENT AND PLAN / ED COURSE  I reviewed the triage vital signs and the nursing notes.  Patient's presentation is most consistent with acute presentation with potential threat to life or bodily function.  Patient presents to the emergency department for lower quadrant abdominal pain nausea vomiting over the last 2 to 3 days.  Patient does have mild to moderate tenderness to palpation in the right lower quadrant, no  rebound or guarding.  No fever.  Patient's lab work today shows a reassuring CBC with white blood cell count of 10.7 down from 13 2 days ago.  Chemistry shows no concerning findings, normal LFTs and lipase.  Urinalysis from 2 days ago was negative however we will recheck again today.  Will treat pain nausea IV hydrate and obtain CT imaging the abdomen and pelvis to further evaluate.  Patient agreeable to plan of care.  Patient states she has had her gallbladder removed however still has her appendix.  Patient is workup is reassuring, normal CBC chemistry normal LFTs and lipase negative pregnancy test normal urinalysis.  Patient CT scan has resulted showing no acute finding.  Will discharge with Bentyl , Phenergan  to take as needed for nausea.  Patient to follow-up with her doctor.  Provided return precautions.  FINAL CLINICAL IMPRESSION(S) / ED  DIAGNOSES   Right lower quadrant abdominal pain.   Note:  This document was prepared using Dragon voice recognition software and may include unintentional dictation errors.   Dorothyann Drivers, MD 07/16/24 [phone]

## 2024-07-16 NOTE — ED Triage Notes (Signed)
 Pt to ED for R sided upper and lower abdominal and side pain and HA and also every time she eats, it's like the food gets stuck in my throat and then a few minutes later I throw it up. Everything started about 5 days ago.  Pt in no acute distress. Skin dry.

## 2024-07-18 ENCOUNTER — Emergency Department: Admission: EM | Admit: 2024-07-18 | Discharge: 2024-07-18 | Disposition: A | Discharge status: Home / Self Care

## 2024-07-18 ENCOUNTER — Other Ambulatory Visit: Payer: Self-pay

## 2024-07-18 DIAGNOSIS — R112 Nausea with vomiting, unspecified: Secondary | ICD-10-CM | POA: Diagnosis present

## 2024-07-18 DIAGNOSIS — L03811 Cellulitis of head [any part, except face]: Secondary | ICD-10-CM | POA: Diagnosis not present

## 2024-07-18 DIAGNOSIS — J45909 Unspecified asthma, uncomplicated: Secondary | ICD-10-CM | POA: Insufficient documentation

## 2024-07-18 LAB — COMPREHENSIVE METABOLIC PANEL WITH GFR
ALT: 38 U/L (ref 0–44)
AST: 37 U/L (ref 15–41)
Albumin: 3.6 g/dL (ref 3.5–5.0)
Alkaline Phosphatase: 87 U/L (ref 38–126)
Anion gap: 8 (ref 5–15)
BUN: 6 mg/dL (ref 6–20)
CO2: 24 mmol/L (ref 22–32)
Calcium: 8.8 mg/dL — ABNORMAL LOW (ref 8.9–10.3)
Chloride: 104 mmol/L (ref 98–111)
Creatinine, Ser: 0.77 mg/dL (ref 0.44–1.00)
GFR, Estimated: >60 mL/min (ref >60)
Glucose, Bld: 99 mg/dL (ref 70–99)
Potassium: 3.4 mmol/L — ABNORMAL LOW (ref 3.5–5.1)
Sodium: 136 mmol/L (ref 135–145)
Total Bilirubin: 0.7 mg/dL (ref 0.0–1.2)
Total Protein: 6.9 g/dL (ref 6.5–8.1)

## 2024-07-18 LAB — CBC WITH DIFFERENTIAL/PLATELET
Abs Immature Granulocytes: 0.09 K/uL — ABNORMAL HIGH (ref 0.00–0.07)
Basophils Absolute: 0.1 K/uL (ref 0.0–0.1)
Basophils Relative: 1 %
Eosinophils Absolute: 0.3 K/uL (ref 0.0–0.5)
Eosinophils Relative: 2 %
HCT: 34.6 % — ABNORMAL LOW (ref 36.0–46.0)
Hemoglobin: 10.2 g/dL — ABNORMAL LOW (ref 12.0–15.0)
Immature Granulocytes: 1 %
Lymphocytes Relative: 14 %
Lymphs Abs: 1.7 K/uL (ref 0.7–4.0)
MCH: 19.6 pg — ABNORMAL LOW (ref 26.0–34.0)
MCHC: 29.5 g/dL — ABNORMAL LOW (ref 30.0–36.0)
MCV: 66.5 fL — ABNORMAL LOW (ref 80.0–100.0)
Monocytes Absolute: 0.8 K/uL (ref 0.1–1.0)
Monocytes Relative: 6 %
Neutro Abs: 9.2 K/uL — ABNORMAL HIGH (ref 1.7–7.7)
Neutrophils Relative %: 76 %
Platelets: 418 K/uL — ABNORMAL HIGH (ref 150–400)
RBC: 5.2 MIL/uL — ABNORMAL HIGH (ref 3.87–5.11)
RDW: 16.4 % — ABNORMAL HIGH (ref 11.5–15.5)
Smear Review: NORMAL
WBC: 12.2 K/uL — ABNORMAL HIGH (ref 4.0–10.5)
nRBC: 0 % (ref 0.0–0.2)

## 2024-07-18 LAB — HCG, QUANTITATIVE, PREGNANCY: hCG, Beta Chain, Quant, S: <1 m[IU]/mL (ref <5)

## 2024-07-18 LAB — LIPASE, BLOOD: Lipase: 28 U/L (ref 11–51)

## 2024-07-18 MED ORDER — DOXYCYCLINE HYCLATE 100 MG PO TABS
100.0000 mg | ORAL_TABLET | Freq: Once | ORAL | Status: DC
  Filled 2024-07-18: qty 1

## 2024-07-18 MED ORDER — DOXYCYCLINE MONOHYDRATE 100 MG PO CAPS
100.0000 mg | ORAL_CAPSULE | Freq: Two times a day (BID) | ORAL | 0 refills | Status: AC
Start: 2024-07-18 — End: 2024-07-25

## 2024-07-18 MED ORDER — IBUPROFEN 200 MG PO TABS: 600.0000 mg | ORAL_TABLET | Freq: Three times a day (TID) | ORAL | 2 refills | Status: AC | PRN

## 2024-07-18 MED ORDER — ACETAMINOPHEN 500 MG PO TABS
1000.0000 mg | ORAL_TABLET | Freq: Once | ORAL | Status: DC
  Filled 2024-07-18: qty 2

## 2024-07-18 MED ORDER — PROCHLORPERAZINE EDISYLATE 10 MG/2ML IJ SOLN
10.0000 mg | Freq: Once | INTRAMUSCULAR | Status: AC
  Administered 2024-07-18: 10 mg via INTRAVENOUS
  Filled 2024-07-18: qty 2

## 2024-07-18 MED ORDER — ACETAMINOPHEN 500 MG PO TABS: 1000.0000 mg | ORAL_TABLET | Freq: Four times a day (QID) | ORAL | 2 refills | Status: AC | PRN

## 2024-07-18 MED ORDER — CEPHALEXIN 500 MG PO CAPS
500.0000 mg | ORAL_CAPSULE | Freq: Two times a day (BID) | ORAL | 0 refills | Status: AC
Start: 2024-07-19 — End: 2024-07-26

## 2024-07-18 MED ORDER — SODIUM CHLORIDE 0.9 % IV BOLUS
500.0000 mL | Freq: Once | INTRAVENOUS | Status: AC
  Administered 2024-07-18: 500 mL via INTRAVENOUS

## 2024-07-18 MED ORDER — PROCHLORPERAZINE MALEATE 10 MG PO TABS: 10.0000 mg | ORAL_TABLET | Freq: Four times a day (QID) | ORAL | 0 refills | Status: AC | PRN

## 2024-07-18 MED ORDER — KETOROLAC TROMETHAMINE 15 MG/ML IJ SOLN
15.0000 mg | Freq: Once | INTRAMUSCULAR | Status: AC
  Administered 2024-07-18: 15 mg via INTRAVENOUS
  Filled 2024-07-18: qty 1

## 2024-07-18 NOTE — Discharge Instructions (Signed)
 Your evaluation in the emergency department was notable for a skin infection of your scalp, and I have started you on 2 antibiotics to treat this.  I have also prescribed an additional nausea medication to use as needed.  Please do follow-up with your primary care provider for reevaluation, and return to the emergency department with any new or worsening symptoms.

## 2024-07-18 NOTE — ED Triage Notes (Signed)
 Pt comes with c/o bumps on her head. Pt states they are very painful. Pt states this started two days ago. Unable to see any visible bumps pt states they are in her hair. Pt states she feels nauseated and can't lay down on her head.

## 2024-07-18 NOTE — ED Provider Notes (Signed)
 [facility] Provider Note    Event Date/Time   First MD Initiated Contact with Patient 07/18/24 1810     (approximate)   History   bumps on head  Pt comes with c/o bumps on her head. Pt states they are very painful. Pt states this started two days ago. Unable to see any visible bumps pt states they are in her hair. Pt states she feels nauseated and can't lay down on her head.    HPI Jodi Tran is a 43 y.o. female PMH asthma, polysubstance use presents for evaluation of intractable nausea vomiting and painful bumps on head - Patient states she has been having about 1 week of nausea and vomiting.  No significant abdominal pain.  No fevers.  No urinary symptoms. - Primarily presents today because over the past several days she has developed bumps on the back of her head that are very painful and prevent her from lying down.  Per chart review, patient was admitted in March 2024 for sepsis secondary to a breast abscess, grew MRSA sensitive to doxycycline .  Last seen in our emergency department on 07/16/2024 for nausea and vomiting.  Workup unremarkable including urinalysis, CT abdomen pelvis.     Physical Exam   Triage Vital Signs: ED Triage Vitals  Encounter Vitals Group     BP 07/18/24 1547 124/88     Girls Systolic BP Percentile --      Girls Diastolic BP Percentile --      Boys Systolic BP Percentile --      Boys Diastolic BP Percentile --      Pulse Rate 07/18/24 1547 81     Resp 07/18/24 1547 18     Temp 07/18/24 1547 98 F (36.7 C)     Temp src --      SpO2 07/18/24 1547 100 %     Weight 07/18/24 1544 140 lb (63.5 kg)     Height 07/18/24 1544 4' 11 (1.499 m)     Head Circumference --      Peak Flow --      Pain Score 07/18/24 1544 10     Pain Loc --      Pain Education --      Exclude from Growth Chart --     Most recent vital signs: Vitals:   07/18/24 1809 07/18/24 1930  BP:  120/66  Pulse:  79  Resp:  20  Temp:  97.7 F (36.5  C)  SpO2: 100% 100%     General: Awake, no distress.  HEENT: Eczematous skin at base of skull/top of neck, 2 discrete bumps (2 cm x 1 cm and 1 cm x 1 cm) in this area with overlying erythema and tender to touch, bedside ultrasound with no underlying fluid pocket.  No meningismus. CV:  Good peripheral perfusion. RRR, RP 2+ Resp:  Normal effort. CTAB Abd:  No distention.  Mild tenderness in upper abdomen only, not peritonitic exam, no cvat b/l    ED Results / Procedures / Treatments   Labs (all labs ordered are listed, but only abnormal results are displayed) Labs Reviewed  COMPREHENSIVE METABOLIC PANEL WITH GFR - Abnormal; Notable for the following components:      Result Value   Potassium 3.4 (*)    Calcium 8.8 (*)    All other components within normal limits  CBC WITH DIFFERENTIAL/PLATELET - Abnormal; Notable for the following components:   WBC 12.2 (*)    RBC 5.20 (*)  Hemoglobin 10.2 (*)    HCT 34.6 (*)    MCV 66.5 (*)    MCH 19.6 (*)    MCHC 29.5 (*)    RDW 16.4 (*)    Platelets 418 (*)    Neutro Abs 9.2 (*)    Abs Immature Granulocytes 0.09 (*)    All other components within normal limits  LIPASE, BLOOD  HCG, QUANTITATIVE, PREGNANCY  URINALYSIS, COMPLETE (UACMP) WITH MICROSCOPIC     EKG  N/a   RADIOLOGY N/a    PROCEDURES:  Critical Care performed: No  Procedures   MEDICATIONS ORDERED IN ED: Medications  acetaminophen  (TYLENOL ) tablet 1,000 mg (1,000 mg Oral Patient Refused/Not Given 07/18/24 1925)  doxycycline  (VIBRA -TABS) tablet 100 mg (100 mg Oral Not Given 07/18/24 1925)  sodium chloride  0.9 % bolus 500 mL (0 mLs Intravenous Stopped 07/18/24 2058)  prochlorperazine  (COMPAZINE ) injection 10 mg (10 mg Intravenous Given 07/18/24 1927)  ketorolac  (TORADOL ) 15 MG/ML injection 15 mg (15 mg Intravenous Given 07/18/24 1925)     IMPRESSION / MDM / ASSESSMENT AND PLAN / ED COURSE  I reviewed the triage vital signs and the nursing notes.                               DDX/MDM/AP: Differential diagnosis includes, but is not limited to, superficial abscesses of scalp.  With regard to intractable nausea and vomiting, consider possibly secondary to polysubstance use, benign abdominal exam here and negative CT abdomen pelvis 2 days ago-will get basic screening labs, give small dose IV fluid and antiemetic and plan for reevaluation.  Plan: - Labs - IV fluid - Compazine , Tylenol , Toradol  -  reassess  Patient's presentation is most consistent with acute presentation with potential threat to life or bodily function.  The patient is on the cardiac monitor to evaluate for evidence of arrhythmia and/or significant heart rate changes.  ED course below.  Feeling much better after IV fluid, Toradol , Compazine .  Repeat abdominal exam benign.  Bedside ultrasound with no underlying fluid pockets over areas of skin induration, no concern for significant underlying abscess discharged with doxycycline  and Keflex  for treatment of scalp cellulitis.  Plan for PMD follow-up.  Also Rx Compazine  in addition to Zofran  she is already using at home.  ED return precautions in place.  Patient agrees with plan  Clinical Course as of 07/18/24 2343  Mon Jul 18, 2024  2018 CBC with mild leukocytosis, overall stable within recent range  CMP reviewed, unremarkable [MM]  2045 Patient reevaluated, notes she is feeling much better after the fluids and Compazine .  Abdominal discomfort resolved.  Requesting discharge home.  Did not provide urine sample but had unremarkable UA 2 days ago.  Will continue with plan to discharge home with doxycycline .  Plan for PMD follow-up.  Can use Tylenol  and Motrin  for pain.  Will prescribe additional antiemetic, is already using Zofran .  ED return precautions in place.  Patient agrees with plan. [MM]    Clinical Course User Index [MM] Clarine Ozell LABOR, MD     FINAL CLINICAL IMPRESSION(S) / ED DIAGNOSES   Final diagnoses:  Nausea and  vomiting, unspecified vomiting type  Cellulitis of occipital region of scalp     Rx / DC Orders   ED Discharge Orders          Ordered    cephALEXin  (KEFLEX ) 500 MG capsule  2 times daily        07/18/24  2048    doxycycline  (MONODOX ) 100 MG capsule  2 times daily        07/18/24 2048    acetaminophen  (TYLENOL ) 500 MG tablet  Every 6 hours PRN        07/18/24 2048    ibuprofen  (MOTRIN  IB) 200 MG tablet  Every 8 hours PRN        07/18/24 2048    prochlorperazine  (COMPAZINE ) 10 MG tablet  Every 6 hours PRN        07/18/24 2048             Note:  This document was prepared using Dragon voice recognition software and may include unintentional dictation errors.   Clarine Ozell LABOR, MD 07/18/24 [phone]

## 2024-07-20 ENCOUNTER — Emergency Department

## 2024-07-20 ENCOUNTER — Other Ambulatory Visit: Payer: Self-pay

## 2024-07-20 ENCOUNTER — Encounter: Payer: Self-pay | Admitting: Emergency Medicine

## 2024-07-20 ENCOUNTER — Emergency Department
Admission: EM | Admit: 2024-07-20 | Discharge: 2024-07-20 | Disposition: A | Discharge status: Home / Self Care | Attending: Emergency Medicine | Admitting: Emergency Medicine

## 2024-07-20 DIAGNOSIS — M25511 Pain in right shoulder: Secondary | ICD-10-CM | POA: Insufficient documentation

## 2024-07-20 DIAGNOSIS — Z720 Tobacco use: Secondary | ICD-10-CM | POA: Insufficient documentation

## 2024-07-20 DIAGNOSIS — S0081XA Abrasion of other part of head, initial encounter: Secondary | ICD-10-CM | POA: Diagnosis not present

## 2024-07-20 DIAGNOSIS — R519 Headache, unspecified: Secondary | ICD-10-CM | POA: Diagnosis present

## 2024-07-20 DIAGNOSIS — J45909 Unspecified asthma, uncomplicated: Secondary | ICD-10-CM | POA: Diagnosis not present

## 2024-07-20 DIAGNOSIS — M7918 Myalgia, other site: Secondary | ICD-10-CM

## 2024-07-20 DIAGNOSIS — Y92410 Unspecified street and highway as the place of occurrence of the external cause: Secondary | ICD-10-CM | POA: Insufficient documentation

## 2024-07-20 MED ORDER — CYCLOBENZAPRINE HCL 5 MG PO TABS: 5.0000 mg | ORAL_TABLET | Freq: Three times a day (TID) | ORAL | 0 refills | Status: AC | PRN
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
## 2024-12-02 ENCOUNTER — Emergency Department

## 2024-12-02 ENCOUNTER — Emergency Department
Admission: EM | Admit: 2024-12-02 | Discharge: 2024-12-02 | Disposition: A | Discharge status: Home / Self Care | Attending: Emergency Medicine | Admitting: Emergency Medicine

## 2024-12-02 ENCOUNTER — Other Ambulatory Visit: Payer: Self-pay

## 2024-12-02 DIAGNOSIS — D649 Anemia, unspecified: Secondary | ICD-10-CM | POA: Diagnosis not present

## 2024-12-02 DIAGNOSIS — E876 Hypokalemia: Secondary | ICD-10-CM | POA: Diagnosis not present

## 2024-12-02 DIAGNOSIS — K219 Gastro-esophageal reflux disease without esophagitis: Secondary | ICD-10-CM | POA: Insufficient documentation

## 2024-12-02 DIAGNOSIS — J45909 Unspecified asthma, uncomplicated: Secondary | ICD-10-CM | POA: Diagnosis not present

## 2024-12-02 DIAGNOSIS — F17200 Nicotine dependence, unspecified, uncomplicated: Secondary | ICD-10-CM | POA: Diagnosis not present

## 2024-12-02 DIAGNOSIS — E860 Dehydration: Secondary | ICD-10-CM | POA: Diagnosis not present

## 2024-12-02 DIAGNOSIS — R0789 Other chest pain: Secondary | ICD-10-CM

## 2024-12-02 LAB — CBC
HCT: 39.9 % (ref 36.0–46.0)
Hemoglobin: 11.4 g/dL — ABNORMAL LOW (ref 12.0–15.0)
MCH: 17.3 pg — ABNORMAL LOW (ref 26.0–34.0)
MCHC: 28.6 g/dL — ABNORMAL LOW (ref 30.0–36.0)
MCV: 60.5 fL — ABNORMAL LOW (ref 80.0–100.0)
Platelets: 440 K/uL — ABNORMAL HIGH (ref 150–400)
RBC: 6.59 MIL/uL — ABNORMAL HIGH (ref 3.87–5.11)
RDW: 21 % — ABNORMAL HIGH (ref 11.5–15.5)
WBC: 14.9 K/uL — ABNORMAL HIGH (ref 4.0–10.5)
nRBC: 0 % (ref 0.0–0.2)

## 2024-12-02 LAB — URINALYSIS, W/ REFLEX TO CULTURE (INFECTION SUSPECTED)
Bacteria, UA: NONE SEEN
Bilirubin Urine: NEGATIVE
Glucose, UA: NEGATIVE mg/dL
Hgb urine dipstick: NEGATIVE
Ketones, ur: 20 mg/dL — AB
Leukocytes,Ua: NEGATIVE
Nitrite: NEGATIVE
Protein, ur: NEGATIVE mg/dL
RBC / HPF: 0 RBC/hpf (ref 0–5)
Specific Gravity, Urine: 1.012 (ref 1.005–1.030)
pH: 6 (ref 5.0–8.0)

## 2024-12-02 LAB — HEPATIC FUNCTION PANEL
ALT: 38 U/L (ref 0–44)
AST: 38 U/L (ref 15–41)
Albumin: 4.3 g/dL (ref 3.5–5.0)
Alkaline Phosphatase: 97 U/L (ref 38–126)
Bilirubin, Direct: 0.2 mg/dL (ref 0.0–0.2)
Indirect Bilirubin: 0.4 mg/dL (ref 0.3–0.9)
Total Bilirubin: 0.6 mg/dL (ref 0.0–1.2)
Total Protein: 7.4 g/dL (ref 6.5–8.1)

## 2024-12-02 LAB — TROPONIN T, HIGH SENSITIVITY: Troponin T High Sensitivity: <15 ng/L (ref 0–19)

## 2024-12-02 LAB — POC URINE PREG, ED: Preg Test, Ur: NEGATIVE

## 2024-12-02 LAB — BASIC METABOLIC PANEL WITH GFR
Anion gap: 17 — ABNORMAL HIGH (ref 5–15)
BUN: 6 mg/dL (ref 6–20)
CO2: 25 mmol/L (ref 22–32)
Calcium: 9.1 mg/dL (ref 8.9–10.3)
Chloride: 91 mmol/L — ABNORMAL LOW (ref 98–111)
Creatinine, Ser: 0.62 mg/dL (ref 0.44–1.00)
GFR, Estimated: >60 mL/min
Glucose, Bld: 89 mg/dL (ref 70–99)
Potassium: 3.2 mmol/L — ABNORMAL LOW (ref 3.5–5.1)
Sodium: 133 mmol/L — ABNORMAL LOW (ref 135–145)

## 2024-12-02 LAB — LIPASE, BLOOD: Lipase: 23 U/L (ref 11–51)

## 2024-12-02 MED ORDER — FAMOTIDINE 20 MG PO TABS
20.0000 mg | ORAL_TABLET | Freq: Once | ORAL | Status: AC
  Administered 2024-12-02: 20 mg via ORAL
  Filled 2024-12-02: qty 1

## 2024-12-02 MED ORDER — ONDANSETRON 4 MG PO TBDP
4.0000 mg | ORAL_TABLET | Freq: Once | ORAL | Status: AC
  Administered 2024-12-02: 4 mg via ORAL
  Filled 2024-12-02: qty 1

## 2024-12-02 MED ORDER — ONDANSETRON 4 MG PO TBDP: 4.0000 mg | ORAL_TABLET | Freq: Three times a day (TID) | ORAL | 0 refills | Status: AC | PRN

## 2024-12-02 MED ORDER — KETOROLAC TROMETHAMINE 30 MG/ML IJ SOLN
30.0000 mg | Freq: Once | INTRAMUSCULAR | Status: AC
  Administered 2024-12-02: 30 mg via INTRAMUSCULAR
  Filled 2024-12-02: qty 1

## 2024-12-02 MED ORDER — ALUM & MAG HYDROXIDE-SIMETH 200-200-20 MG/5ML PO SUSP
30.0000 mL | Freq: Once | ORAL | Status: AC
  Administered 2024-12-02: 30 mL via ORAL
  Filled 2024-12-02: qty 30

## 2024-12-02 MED ORDER — LIDOCAINE 5 % EX PTCH
2.0000 | MEDICATED_PATCH | Freq: Once | CUTANEOUS | Status: DC
  Administered 2024-12-02: 2 via TRANSDERMAL
  Filled 2024-12-02: qty 2

## 2024-12-02 MED ORDER — OMEPRAZOLE MAGNESIUM 20 MG PO TBEC: 20.0000 mg | DELAYED_RELEASE_TABLET | Freq: Every day | ORAL | 0 refills | Status: AC

## 2024-12-02 MED ORDER — LIDOCAINE VISCOUS HCL 2 % MT SOLN
15.0000 mL | Freq: Once | OROMUCOSAL | Status: AC
  Administered 2024-12-02: 15 mL via ORAL
  Filled 2024-12-02: qty 15

## 2024-12-02 NOTE — ED Triage Notes (Signed)
 Pt presents to ED from home C/O mid sternal chest pain, SOB, nausea, fever, chills X 3-4 days.

## 2024-12-02 NOTE — Discharge Instructions (Addendum)
 You are seen in the emergency department for shortness of breath and chest pain.  Your chest x-ray did not show any signs of pneumonia.  Your lab work was overall reassuring.  You were started on a acid reducing medication.  It is importantly stay hydrated and drink plenty of fluids.  You did appear dehydrated in the emergency department.  Get Pedialyte and drink increased fluids throughout the day.  You are given nausea medication and medications for acid reflux.  If you have worsening signs of dehydration reevaluated the emergency department.  You are given information to follow-up with primary care provider.  You can use over-the-counter Lidoderm  patches for your chest wall pain.  Your potassium was mildly low when checked today.  Make sure to follow up with a primary doctor to follow up your labs.  Make sure to eat food high in potassium and magnesium  - examples - potatoes, spinach, bananas, beans, avocadoes, oranges, nuts.  You are found to be anemic in the emergency department.  Restart your iron tablets.  Follow-up closely with your primary care physician so they can recheck your lab work.

## 2024-12-02 NOTE — ED Provider Notes (Signed)
 "  [facility] Provider Note    Event Date/Time   First MD Initiated Contact with Patient 12/02/24 1216     (approximate)   History   Shortness of Breath   HPI  Jodi Tran is a 44 y.o. female patient with past medical history significant for asthma, tobacco use, presents to the emergency department for chest wall pain and symptoms concerning for acid reflux.  States that she is concern for acid reflux and has a history of acid reflux which was her main concern today.  States that she is having chest wall pain that is worse to touch.  Denies any falls or trauma.  Not worse with exertion.  Denies any blood in her stool or melanotic stool.  States that she feels dehydrated and that she was throwing up up couple of days ago.  States that over the past 2 days she has not been throwing up and she has been keeping down fluids.  Denies any lower abdominal pain.  Denies any diarrhea at this time.  Denies any significant fever.  States that she was having chills at home.  No significant cough.     Physical Exam   Triage Vital Signs: ED Triage Vitals  Encounter Vitals Group     BP 12/02/24 1149 124/88     Girls Systolic BP Percentile --      Girls Diastolic BP Percentile --      Boys Systolic BP Percentile --      Boys Diastolic BP Percentile --      Pulse Rate 12/02/24 1149 81     Resp 12/02/24 1149 18     Temp 12/02/24 1149 98.2 F (36.8 C)     Temp Source 12/02/24 1149 Oral     SpO2 12/02/24 1149 100 %     Weight 12/02/24 1146 150 lb (68 kg)     Height 12/02/24 1146 4' 11 (1.499 m)     Head Circumference --      Peak Flow --      Pain Score 12/02/24 1146 6     Pain Loc --      Pain Education --      Exclude from Growth Chart --     Most recent vital signs: Vitals:   12/02/24 1500 12/02/24 1530  BP: (!) 112/53 (!) 151/79  Pulse: 65 (!) 57  Resp:    Temp:    SpO2: 97% 97%    Physical Exam Constitutional:      Appearance: She is  well-developed.     Comments: Drinking water in the room  HENT:     Head: Atraumatic.  Eyes:     Conjunctiva/sclera: Conjunctivae normal.  Cardiovascular:     Rate and Rhythm: Regular rhythm.  Pulmonary:     Effort: Pulmonary effort is normal. No respiratory distress.  Chest:     Chest wall: Tenderness present.  Abdominal:     General: There is no distension.     Palpations: Abdomen is soft.     Tenderness: There is abdominal tenderness (Mild epigastric abdominal tenderness to palpation with no rebound or guarding).  Musculoskeletal:        General: Normal range of motion.     Cervical back: Normal range of motion.     Right lower leg: No edema.     Left lower leg: No edema.  Skin:    General: Skin is warm and dry.     Capillary Refill: Capillary refill takes less  than 2 seconds.     Comments: Dry mucous membranes  Neurological:     General: No focal deficit present.     Mental Status: She is alert. Mental status is at baseline.     IMPRESSION / MDM / ASSESSMENT AND PLAN / ED COURSE  I reviewed the triage vital signs and the nursing notes.  Differential diagnosis including musculoskeletal strain, gastritis/PUD, viral illness including COVID/influenza, urinary tract infection, dehydration, pregnancy, ACS, pneumonia   EKG  I, Clotilda Punter, the attending physician, personally viewed and interpreted this ECG.  EKG showed normal sinus rhythm.  No significant ST elevation or depression.  Nonspecific changes.  QTc 443.  Narrow complex.  136 is PR interval.  No significant change when compared to prior EKG in May 2025  No tachycardic or bradycardic dysrhythmias while on cardiac telemetry.  RADIOLOGY I independently reviewed imaging, my interpretation of imaging: Chest x-ray no signs of acute pneumonia.  Read as no acute findings.  LABS (all labs ordered are listed, but only abnormal results are displayed) Labs interpreted as -    Labs Reviewed  BASIC METABOLIC PANEL  WITH GFR - Abnormal; Notable for the following components:      Result Value   Sodium 133 (*)    Potassium 3.2 (*)    Chloride 91 (*)    Anion gap 17 (*)    All other components within normal limits  CBC - Abnormal; Notable for the following components:   WBC 14.9 (*)    RBC 6.59 (*)    Hemoglobin 11.4 (*)    MCV 60.5 (*)    MCH 17.3 (*)    MCHC 28.6 (*)    RDW 21.0 (*)    Platelets 440 (*)    All other components within normal limits  URINALYSIS, W/ REFLEX TO CULTURE (INFECTION SUSPECTED) - Abnormal; Notable for the following components:   Color, Urine YELLOW (*)    APPearance CLEAR (*)    Ketones, ur 20 (*)    All other components within normal limits  HEPATIC FUNCTION PANEL  LIPASE, BLOOD  POC URINE PREG, ED  TROPONIN T, HIGH SENSITIVITY     MDM Patient appears significantly dehydrated on exam.  Given treatment for acid reflux and antiemetics.  Patient is drinking a significant amount of water at bedside.  Troponin is undetectable, have a low suspicion for ACS, low risk heart score.  EKG without findings of acute ischemia or dysrhythmia.  Atypical chest pain that is very reproducible on tenderness tenderness to palpation to lower ribs bilaterally, likely in the setting of her recent vomiting and concern for musculoskeletal strain.  Lab work with leukocytosis.  Possible viral gastroenteritis.  Does have significant anemia that appears microcytic, patient has been on iron supplements in the past and discussed restarting them.  No signs or symptoms significant for GI bleed.  Patient's creatinine is at her baseline.  Mild hyponatremia of 133 which is likely in the setting of volume depletion.  Potassium mildly low at 3.2 likely in setting of vomiting and diarrhea.  Does have a mildly elevated anion gap of 17 but normal glucose of 89 and normal CO2 of 25.  Lipase is normal.  Added on LFTs which were also normal and had no elevation of her T. bili.  Urine with ketones but no signs  of a urinary tract infection and pregnancy test is negative.  On reevaluation patient states she is feeling much better.  Was given a GI cocktail and antiemetics  and able to tolerate p.o.  States that she will increase oral hydration.  Discussed close follow-up with primary care physician and discussed strict return precautions for if she had any worsening symptoms or new symptoms that were concerning.      PROCEDURES:  Critical Care performed: No  Procedures  Patient's presentation is most consistent with acute presentation with potential threat to life or bodily function.   MEDICATIONS ORDERED IN ED: Medications  lidocaine  (LIDODERM ) 5 % 2 patch (2 patches Transdermal Patch Applied 12/02/24 1327)  ondansetron  (ZOFRAN -ODT) disintegrating tablet 4 mg (4 mg Oral Given 12/02/24 1320)  ketorolac  (TORADOL ) 30 MG/ML injection 30 mg (30 mg Intramuscular Given 12/02/24 1320)  famotidine  (PEPCID ) tablet 20 mg (20 mg Oral Given 12/02/24 1323)  alum & mag hydroxide-simeth (MAALOX/MYLANTA) 200-200-20 MG/5ML suspension 30 mL (30 mLs Oral Given 12/02/24 1452)    And  lidocaine  (XYLOCAINE ) 2 % viscous mouth solution 15 mL (15 mLs Oral Given 12/02/24 1451)    FINAL CLINICAL IMPRESSION(S) / ED DIAGNOSES   Final diagnoses:  Gastroesophageal reflux disease, unspecified whether esophagitis present  Chest wall pain  Anemia, unspecified type  Hypokalemia  Dehydration     Rx / DC Orders   ED Discharge Orders          Ordered    Ambulatory Referral to Primary Care (Establish Care)        12/02/24 1229    ondansetron  (ZOFRAN -ODT) 4 MG disintegrating tablet  Every 8 hours PRN        12/02/24 1525    omeprazole  (PRILOSEC  OTC) 20 MG tablet  Daily        12/02/24 1525             Note:  This document was prepared using Dragon voice recognition software and may include unintentional dictation errors.   Suzanne Kirsch, MD 12/02/24 1558  "

## 2024-12-02 NOTE — ED Notes (Signed)
Patient encouraged to obtain urine specimen

## 2024-12-06 ENCOUNTER — Other Ambulatory Visit: Payer: Self-pay

## 2024-12-06 ENCOUNTER — Emergency Department

## 2024-12-06 ENCOUNTER — Emergency Department
Admission: EM | Admit: 2024-12-06 | Discharge: 2024-12-06 | Disposition: A | Discharge status: Home / Self Care | Attending: Emergency Medicine | Admitting: Emergency Medicine

## 2024-12-06 DIAGNOSIS — R079 Chest pain, unspecified: Secondary | ICD-10-CM | POA: Diagnosis present

## 2024-12-06 DIAGNOSIS — E871 Hypo-osmolality and hyponatremia: Secondary | ICD-10-CM | POA: Diagnosis not present

## 2024-12-06 DIAGNOSIS — K219 Gastro-esophageal reflux disease without esophagitis: Secondary | ICD-10-CM

## 2024-12-06 DIAGNOSIS — J219 Acute bronchiolitis, unspecified: Secondary | ICD-10-CM | POA: Insufficient documentation

## 2024-12-06 DIAGNOSIS — J45909 Unspecified asthma, uncomplicated: Secondary | ICD-10-CM | POA: Insufficient documentation

## 2024-12-06 DIAGNOSIS — Z87891 Personal history of nicotine dependence: Secondary | ICD-10-CM | POA: Diagnosis not present

## 2024-12-06 LAB — CBC
HCT: 41.7 % (ref 36.0–46.0)
Hemoglobin: 11.7 g/dL — ABNORMAL LOW (ref 12.0–15.0)
MCH: 17.3 pg — ABNORMAL LOW (ref 26.0–34.0)
MCHC: 28.1 g/dL — ABNORMAL LOW (ref 30.0–36.0)
MCV: 61.6 fL — ABNORMAL LOW (ref 80.0–100.0)
Platelets: 403 K/uL — ABNORMAL HIGH (ref 150–400)
RBC: 6.77 MIL/uL — ABNORMAL HIGH (ref 3.87–5.11)
RDW: 21.4 % — ABNORMAL HIGH (ref 11.5–15.5)
WBC: 9.7 K/uL (ref 4.0–10.5)
nRBC: 0 % (ref 0.0–0.2)

## 2024-12-06 LAB — TROPONIN T, HIGH SENSITIVITY: Troponin T High Sensitivity: <15 ng/L (ref 0–19)

## 2024-12-06 LAB — BASIC METABOLIC PANEL WITH GFR
Anion gap: 12 (ref 5–15)
BUN: <5 mg/dL — ABNORMAL LOW (ref 6–20)
CO2: 27 mmol/L (ref 22–32)
Calcium: 9.1 mg/dL (ref 8.9–10.3)
Chloride: 94 mmol/L — ABNORMAL LOW (ref 98–111)
Creatinine, Ser: 0.72 mg/dL (ref 0.44–1.00)
GFR, Estimated: >60 mL/min
Glucose, Bld: 117 mg/dL — ABNORMAL HIGH (ref 70–99)
Potassium: 3.5 mmol/L (ref 3.5–5.1)
Sodium: 133 mmol/L — ABNORMAL LOW (ref 135–145)

## 2024-12-06 MED ORDER — ONDANSETRON 4 MG PO TBDP
4.0000 mg | ORAL_TABLET | Freq: Once | ORAL | Status: AC
  Administered 2024-12-06: 4 mg via ORAL
  Filled 2024-12-06: qty 1

## 2024-12-06 MED ORDER — PANTOPRAZOLE SODIUM 40 MG PO TBEC
40.0000 mg | DELAYED_RELEASE_TABLET | Freq: Once | ORAL | Status: AC
  Administered 2024-12-06: 40 mg via ORAL
  Filled 2024-12-06: qty 1

## 2024-12-06 MED ORDER — LIDOCAINE VISCOUS HCL 2 % MT SOLN
15.0000 mL | Freq: Once | OROMUCOSAL | Status: AC
  Administered 2024-12-06: 15 mL via ORAL
  Filled 2024-12-06: qty 15

## 2024-12-06 MED ORDER — ALUM & MAG HYDROXIDE-SIMETH 200-200-20 MG/5ML PO SUSP
30.0000 mL | Freq: Once | ORAL | Status: AC
  Administered 2024-12-06: 30 mL via ORAL
  Filled 2024-12-06: qty 30

## 2024-12-06 NOTE — Discharge Instructions (Signed)
 Please pick up your acid reflux medications.  Please make sure to follow-up once you get your primary care referral.

## 2024-12-06 NOTE — ED Triage Notes (Signed)
 Pt presents to the ED via POV from home with left sided chest pain that started earlier today. Reports heartburn and emesis as well. Pt reports a squeezing sensation in her chest.

## 2024-12-06 NOTE — ED Provider Notes (Signed)
 [facility] Provider Note    Event Date/Time   First MD Initiated Contact with Patient 12/06/24 1957     (approximate)   History   Chest Pain   HPI  Jodi Tran is a 44 y.o. female show asthma, smoking history, presenting with left-sided chest pain that started today.  Does report nausea vomiting as well as symptoms of heartburn.  Denies other symptoms.  States that she did not have the money to pick up her acid reflux medications and has not been taking them hence the acid reflux symptoms for last couple days.  States that she would be able to pick up her medications tomorrow.  States that she is waiting for a callback to set up primary care.  On independent chart review, patient has been seen previously for GERD and acid reflux symptoms.     Physical Exam   Triage Vital Signs: ED Triage Vitals  Encounter Vitals Group     BP 12/06/24 1648 105/80     Girls Systolic BP Percentile --      Girls Diastolic BP Percentile --      Boys Systolic BP Percentile --      Boys Diastolic BP Percentile --      Pulse Rate 12/06/24 1648 84     Resp 12/06/24 1648 18     Temp 12/06/24 1648 98 F (36.7 C)     Temp Source 12/06/24 1648 Oral     SpO2 12/06/24 1648 95 %     Weight 12/06/24 1644 149 lb 14.6 oz (68 kg)     Height 12/06/24 1644 4' 11 (1.499 m)     Head Circumference --      Peak Flow --      Pain Score 12/06/24 1644 7     Pain Loc --      Pain Education --      Exclude from Growth Chart --     Most recent vital signs: Vitals:   12/06/24 1648 12/06/24 2037  BP: 105/80 (!) 115/92  Pulse: 84 100  Resp: 18 16  Temp: 98 F (36.7 C) 98 F (36.7 C)  SpO2: 95% 96%     General: Awake, no distress.  CV:  Good peripheral perfusion.  Resp:  Normal effort.  No tachypnea or respiratory distress Abd:  No distention.  Soft nontender Other:  Nontoxic-appearing   ED Results / Procedures / Treatments   Labs (all labs ordered are listed, but  only abnormal results are displayed) Labs Reviewed  BASIC METABOLIC PANEL WITH GFR - Abnormal; Notable for the following components:      Result Value   Sodium 133 (*)    Chloride 94 (*)    Glucose, Bld 117 (*)    BUN <5 (*)    All other components within normal limits  CBC - Abnormal; Notable for the following components:   RBC 6.77 (*)    Hemoglobin 11.7 (*)    MCV 61.6 (*)    MCH 17.3 (*)    MCHC 28.1 (*)    RDW 21.4 (*)    Platelets 403 (*)    All other components within normal limits  POC URINE PREG, ED  TROPONIN T, HIGH SENSITIVITY     EKG  EKG shows, sinus rhythm with sinus arrhythmia, rate 94, normal QS, normal QTc, no obvious ischemic ST elevation, not significantly changed compared to prior   RADIOLOGY On my independent interpretation, chest x-ray without obvious consolidation  PROCEDURES:  Critical Care performed: No  Procedures   MEDICATIONS ORDERED IN ED: Medications  alum & mag hydroxide-simeth (MAALOX/MYLANTA) 200-200-20 MG/5ML suspension 30 mL (30 mLs Oral Given 12/06/24 2039)    And  lidocaine  (XYLOCAINE ) 2 % viscous mouth solution 15 mL (15 mLs Oral Given 12/06/24 2039)  pantoprazole  (PROTONIX ) EC tablet 40 mg (40 mg Oral Given 12/06/24 2038)  ondansetron  (ZOFRAN -ODT) disintegrating tablet 4 mg (4 mg Oral Given 12/06/24 2039)     IMPRESSION / MDM / ASSESSMENT AND PLAN / ED COURSE  I reviewed the triage vital signs and the nursing notes.                              Differential diagnosis includes, but is not limited to, GERD, acid reflux, gastritis, ACS, arrhythmia.  EKG, troponin, chest x-ray, GI cocktail.  Patient's presentation is most consistent with acute presentation with potential threat to life or bodily function.  Independent interpretation of labs and imaging below.  Patient is tolerating p.o., symptoms are improving.  Discussed with her about following up with primary care when she gets established.  Did discuss with her about  picking up her medications for acid reflux, she states she will be able to do so tomorrow.  Otherwise considered but no indication for inpatient admission at this time, she safe for outpatient management.  Will discharge with strict return precautions.   Clinical Course as of 12/06/24 2042  Tue Dec 06, 2024  2004 DG Chest 2 View No active cardiopulmonary disease.  [TT]  2028 Independent review of labs, troponins not elevated, no leukocytosis, H&H is stable, electrolytes not severely deranged, creatinine is normal. [TT]  2035 Patient is refusing pregnancy test, states that she had 1 4 days ago that is negative. [TT]    Clinical Course User Index [TT] Waymond Lorelle Cummins, MD     FINAL CLINICAL IMPRESSION(S) / ED DIAGNOSES   Final diagnoses:  Chest pain, unspecified type  Gastroesophageal reflux disease, unspecified whether esophagitis present     Rx / DC Orders   ED Discharge Orders     None        Note:  This document was prepared using Dragon voice recognition software and may include unintentional dictation errors.    Waymond Lorelle Cummins, MD 12/06/24 [phone]

## 2024-12-06 NOTE — ED Notes (Signed)
 Pt refused POC preg test, states she had one Friday and knows she is not pregnant, Tan, MD made aware.
